# Patient Record
Sex: Female | Born: 1975 | Race: White | Hispanic: No
Health system: Southern US, Academic
[De-identification: ages and names within clinical notes are randomized; demographics above are authoritative.]

---
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
## 2022-01-15 ENCOUNTER — Other Ambulatory Visit (HOSPITAL_COMMUNITY): Payer: Self-pay | Admitting: Family Medicine

## 2022-01-15 DIAGNOSIS — Z1231 Encounter for screening mammogram for malignant neoplasm of breast: Secondary | ICD-10-CM

## 2022-01-30 ENCOUNTER — Ambulatory Visit (HOSPITAL_COMMUNITY): Payer: Self-pay

## 2022-02-02 ENCOUNTER — Ambulatory Visit (HOSPITAL_COMMUNITY): Payer: Self-pay

## 2022-03-06 ENCOUNTER — Ambulatory Visit (HOSPITAL_BASED_OUTPATIENT_CLINIC_OR_DEPARTMENT_OTHER): Payer: Self-pay

## 2022-03-06 ENCOUNTER — Other Ambulatory Visit: Payer: Self-pay

## 2022-11-23 ENCOUNTER — Other Ambulatory Visit (HOSPITAL_COMMUNITY): Payer: Self-pay | Admitting: Family Medicine

## 2022-11-23 DIAGNOSIS — Z1231 Encounter for screening mammogram for malignant neoplasm of breast: Secondary | ICD-10-CM

## 2022-11-30 ENCOUNTER — Other Ambulatory Visit: Payer: Self-pay

## 2022-11-30 ENCOUNTER — Encounter (HOSPITAL_COMMUNITY): Payer: Self-pay

## 2022-11-30 ENCOUNTER — Inpatient Hospital Stay
Admission: RE | Admit: 2022-11-30 | Discharge: 2022-11-30 | Disposition: A | Discharge status: Home / Self Care | Payer: 59 | Source: Ambulatory Visit | Attending: Family Medicine

## 2022-11-30 DIAGNOSIS — Z1231 Encounter for screening mammogram for malignant neoplasm of breast: Secondary | ICD-10-CM | POA: Insufficient documentation

## 2022-12-11 ENCOUNTER — Other Ambulatory Visit (HOSPITAL_COMMUNITY): Payer: Self-pay

## 2022-12-11 ENCOUNTER — Inpatient Hospital Stay
Admission: RE | Admit: 2022-12-11 | Discharge: 2022-12-11 | Disposition: A | Discharge status: Home / Self Care | Payer: 59 | Source: Ambulatory Visit

## 2022-12-11 ENCOUNTER — Inpatient Hospital Stay (HOSPITAL_BASED_OUTPATIENT_CLINIC_OR_DEPARTMENT_OTHER)
Admission: RE | Admit: 2022-12-11 | Discharge: 2022-12-11 | Disposition: A | Discharge status: Home / Self Care | Payer: 59 | Source: Ambulatory Visit

## 2022-12-11 ENCOUNTER — Other Ambulatory Visit: Payer: Self-pay

## 2022-12-11 DIAGNOSIS — R079 Chest pain, unspecified: Secondary | ICD-10-CM

## 2022-12-11 DIAGNOSIS — R9431 Abnormal electrocardiogram [ECG] [EKG]: Secondary | ICD-10-CM

## 2022-12-11 LAB — ECG 12 LEAD
Atrial Rate: 78 {beats}/min
Calculated P Axis: 28 degrees
Calculated R Axis: 25 degrees
Calculated T Axis: 35 degrees
PR Interval: 140 ms
QRS Duration: 70 ms
QT Interval: 370 ms
QTC Calculation: 421 ms
Ventricular rate: 78 {beats}/min

## 2023-01-30 IMAGING — MR MRI UPPER EXTREMITY W/O LT
5 of 8 series · 25 of 40 positions shown · IV contrast (gadolinium)
Comparison: None available.

﻿EXAM:  06161   MRI UPPER EXTREMITY W/O LT
INDICATION: Left elbow pain in the region of biceps tendon insertion.  No known history of trauma.  Prior forearm surgery, details of which are not available.
TECHNIQUE: Multiplanar, multisequential MRI of the left elbow was performed without gadolinium contrast.

[Series 7: PD · sagittal · left · 4.0mm · 0.33mm/px · 5 of 22 slices shown]
[im 1/22]
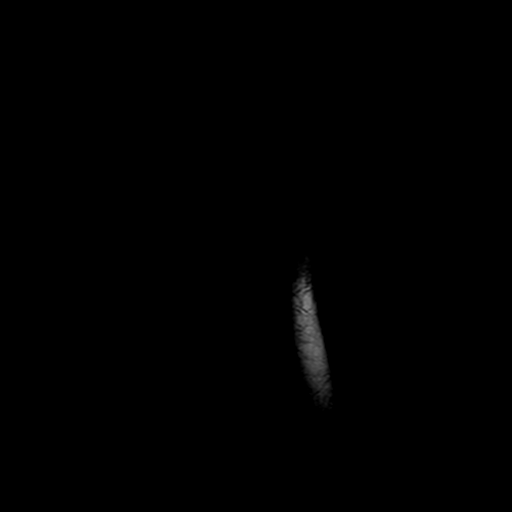
[im 5/22]
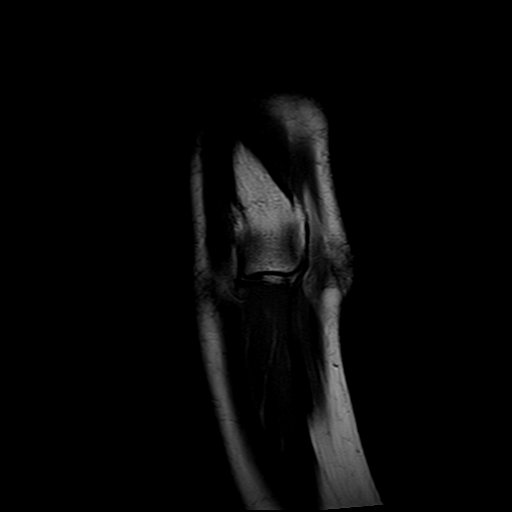
[im 9/22]
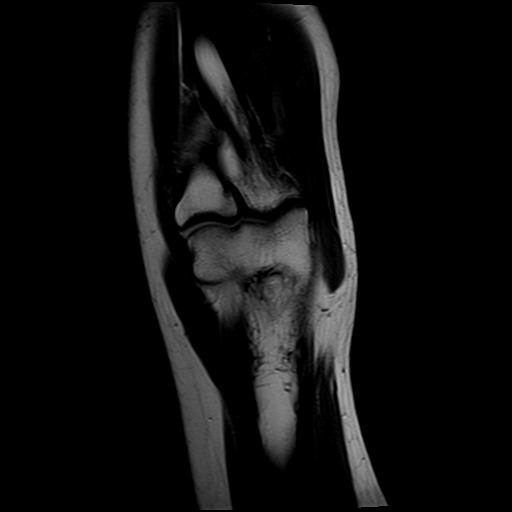
[im 13/22]
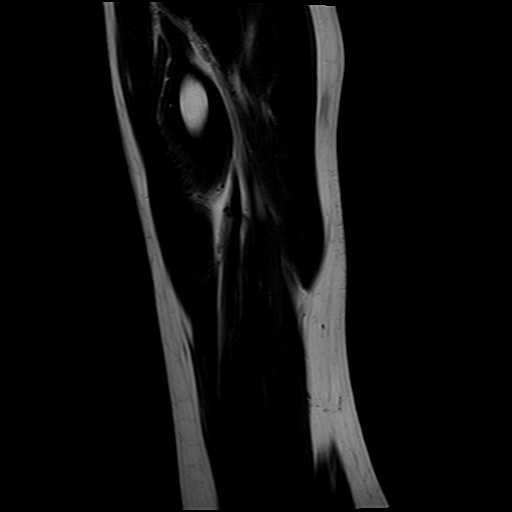
[im 17/22]
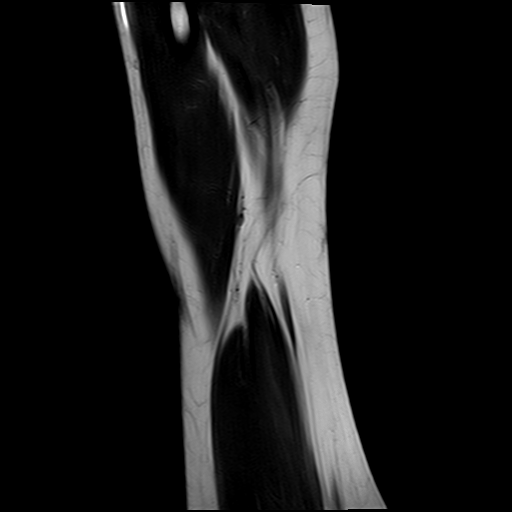

[Series 8: PD fat-sat · sagittal · left · 4.0mm · 0.44mm/px · 6 of 22 slices shown (1 of 4)]
[im 1/22]
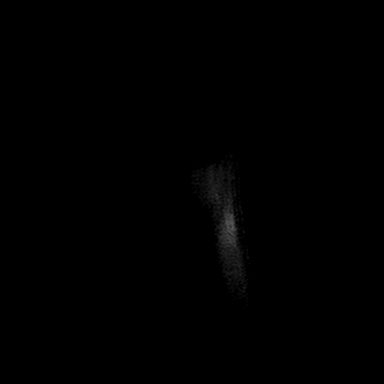
[im 5/22]
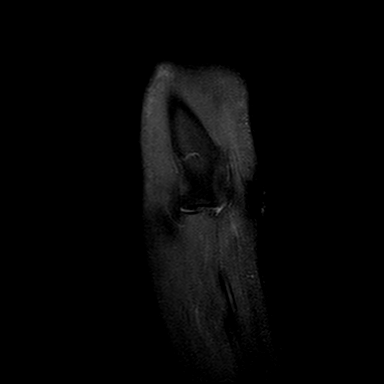
[im 9/22]
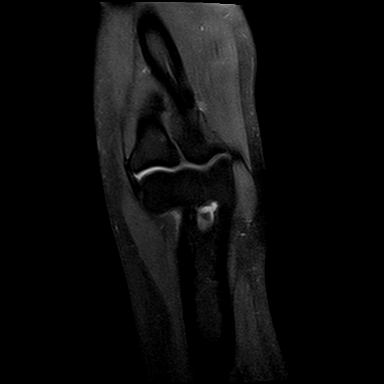
[im 13/22]
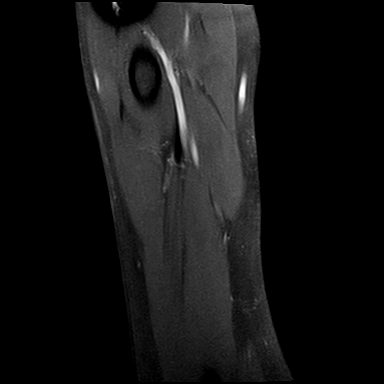
[im 17/22]
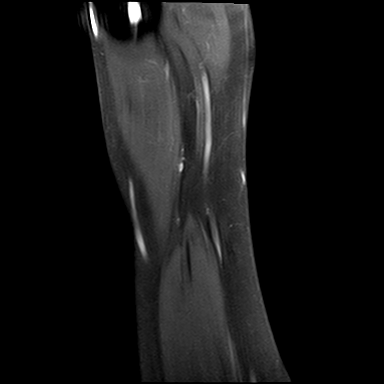
[im 22/22]
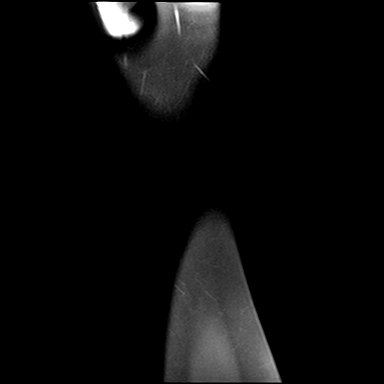

[Series 10: PD fat-sat · coronal · left · 3.5mm · 0.33mm/px · 4 of 20 slices shown (2 of 4)]
[im 1/20]
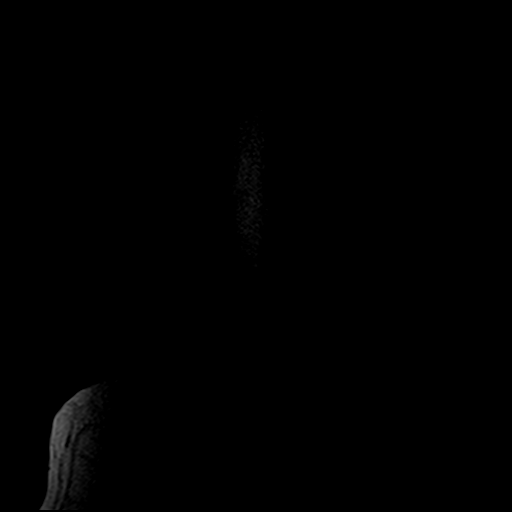
[im 7/20]
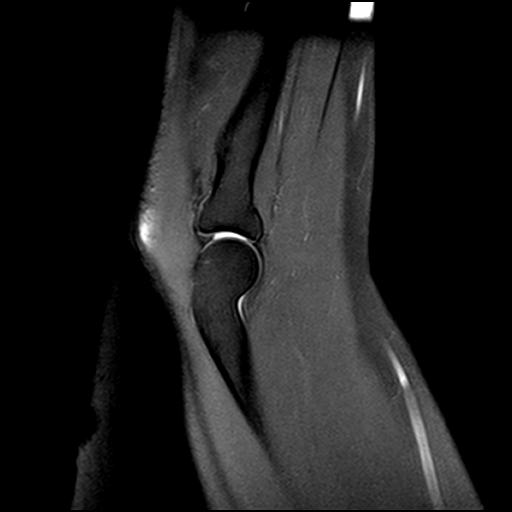
[im 13/20]
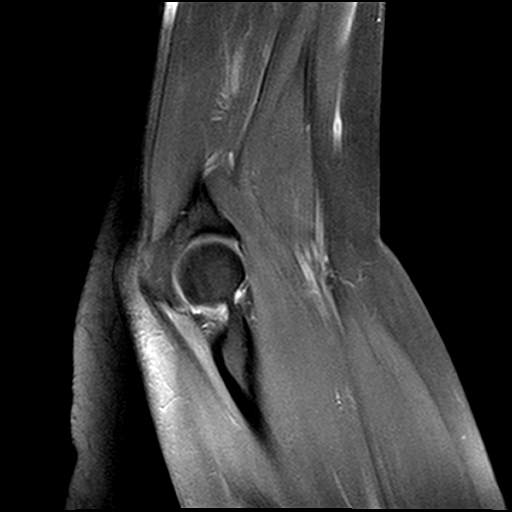
[im 20/20]
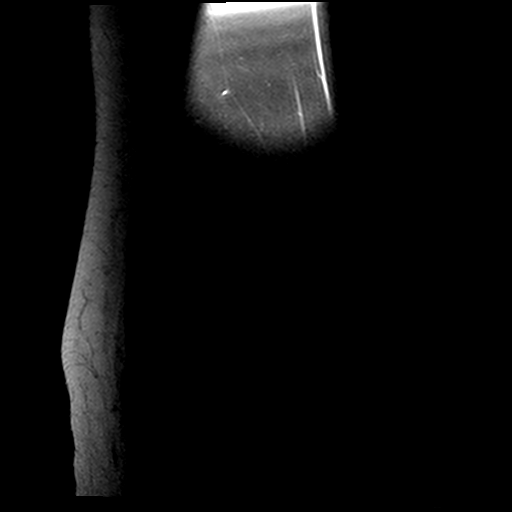

[Series 12: PD fat-sat · axial · left · 5.0mm · 0.36mm/px · z∈[-101,+28]mm · 5 of 25 slices shown (3 of 4)]
[im 1/25]
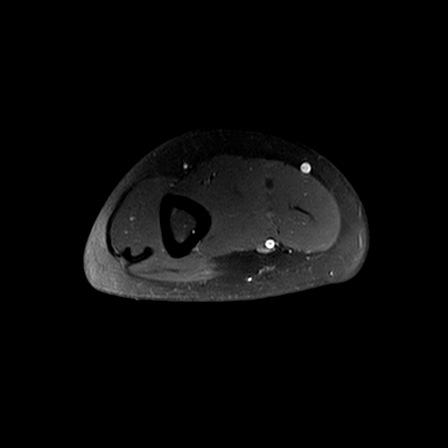
[im 7/25]
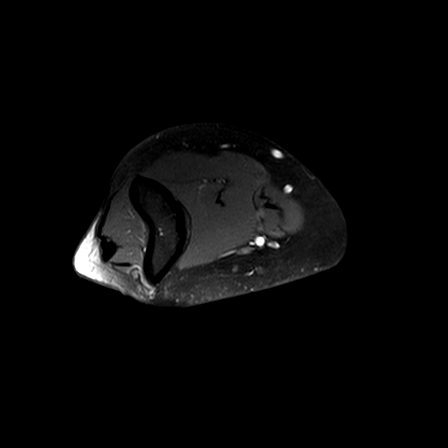
[im 13/25]
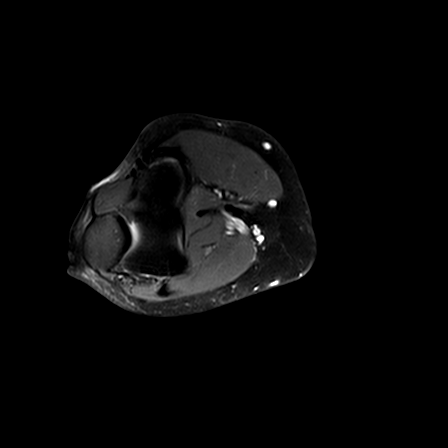
[im 19/25]
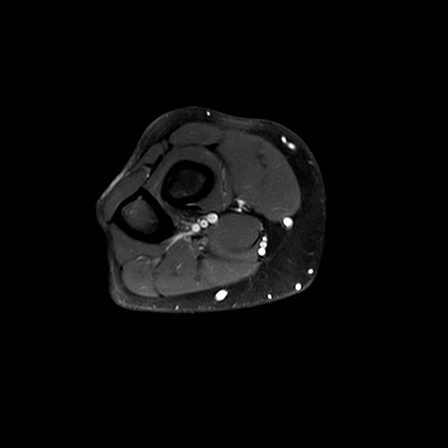
[im 25/25]
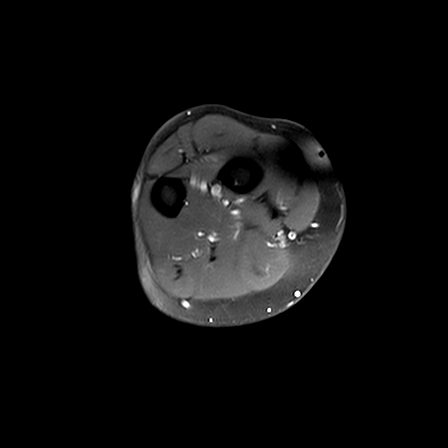

[Series 19: PD fat-sat · axial · left · 5.0mm · 0.36mm/px · z∈[-125,-16]mm · 5 of 24 slices shown (4 of 4)]
[im 1/24]
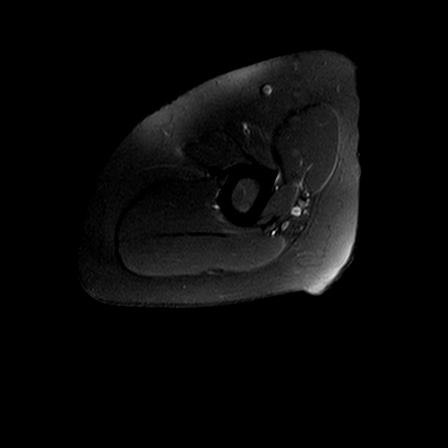
[im 6/24]
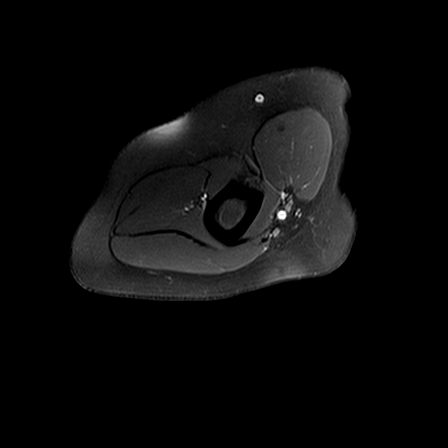
[im 12/24]
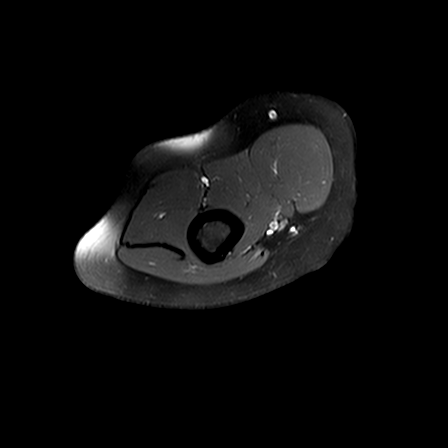
[im 18/24]
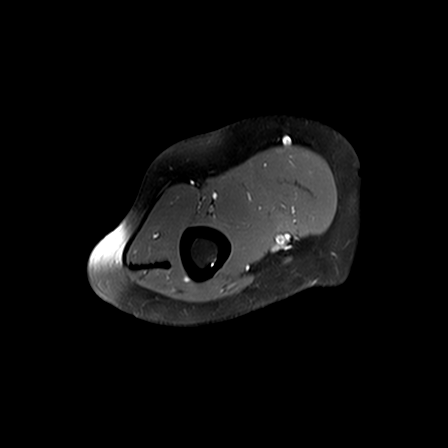
[im 24/24]
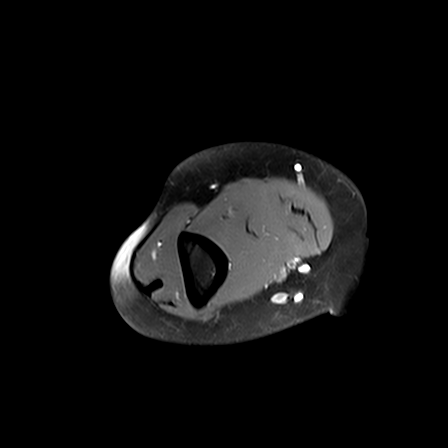

[25 of 40 positions shown; findings below may reference images not displayed]

FINDINGS: No acute bony lesions are seen at the left elbow.  The insertion of biceps tendon and brachialis tendon are normal at the elbow.

Triceps tendon insertion is normal.  No focal lesions of the articular surface of the elbow joint are noted. Radial and ulnar collateral ligaments are intact.  Vascular structures at the elbow are normal. Minimally prominent supratrochlear lymph nodes are noted in the distal upper arm.
IMPRESSION: 1. No acute bony lesions at the left elbow.

2. Distal insertion of biceps tendon and brachialis tendon are normal at the elbow.  No articular lesions of the elbow are seen.  Collateral ligaments are intact.

## 2023-04-04 ENCOUNTER — Other Ambulatory Visit: Payer: Self-pay

## 2023-04-12 ENCOUNTER — Encounter (HOSPITAL_COMMUNITY): Payer: Self-pay

## 2023-04-12 ENCOUNTER — Other Ambulatory Visit: Payer: Self-pay

## 2023-04-12 ENCOUNTER — Ambulatory Visit (HOSPITAL_COMMUNITY)
Admission: RE | Admit: 2023-04-12 | Discharge: 2023-04-12 | Disposition: A | Discharge status: Still In Hospital | Payer: 59 | Source: Ambulatory Visit

## 2023-04-12 ENCOUNTER — Other Ambulatory Visit (HOSPITAL_COMMUNITY): Payer: Self-pay

## 2023-04-12 DIAGNOSIS — M7522 Bicipital tendinitis, left shoulder: Secondary | ICD-10-CM

## 2023-04-12 NOTE — PT Evaluation (Signed)
[facility] Medicine [facility]  Outpatient Physical Therapy  [address]  [place], [address]  [phone]  (Fax) [phone]       Physical Therapy Upper Extremity Evaluation    Date: 04/12/2023  Patient's Name: Erica Solomon  Date of Birth: Nov 01, 1975  Physical Therapy Evaluation      Reason for Referral: L bicep tendinitis   PT diagnosis: L bicep tendinitis, L bicep strain, L shoulder impingement              SUBJECTIVE    Date of onset: 4 months ago     Mechanism of injury: gradual onset. Notes she work up one morning and reached for her creamer in the fridge and felt immediate stabbing pain.     Previous episodes/treatments: steroid injection 3 weeks ago with 2 days of relief     PLOF: no prior issues with L shoulder or UE     Medications for this problem:  none reported prescription or OTC    Diagnostic tests: x-ray revealing mild AC joint OA. Patient reports having MRI of L shoulder at [facility] with reports of no significant findings.     Patient goals: REDUCE PAIN and NORMALIZE FUNCTION    Occupation:  Clinical research associate     Next MD visit: 6 months, TBD    Pain location: L anterior shoulder and bicep soft tissue             Pain description: SHARP, DULL, ACHING, and STABBING    Pain frequency:  CONTINUOUS    Pain rating: Now 1/10   Best 1/10   Worst 10/10    Radiculopathy: denies     Pain increases with: ACTIVITY, LIFTING, and reaching behind back or head, sleeping on L side            decreases with : COLD and REST    Sensation: denies     Weakness: L UE     Sleep affected: denies     Headaches: denies     Dizziness: denies     Subjective Functional Reports:      Lifting: LIMITED        Patient-Specific Functional Score:    Problem Score   1. Washing hair/reaching behind back  5   2. Swimming  0   3. Shaving  7   TOTAL 4       OBJECTIVE    Shoulder AROM   right left   Flexion 180 97   Extension 80 50   Abduction 180 78   Adduction NT NT   ER C7 L ear   IR  Contralateral inferior border of scapula  L buttock        Elbow AROM    right left   Flexion [facility]   Extension [facility] WFL   Supination  WFL WFL   Pronation WFL WFL      ROM comment: LE elbow AROM WFL and non-painful       Strength (defined as __/5 based on 0/5 - 5/5 grading system)   right left   Shoulder flexion 5 4+   Shoulder abduction  5 4+   Shoulder IR 5 4   Shoulder ER 5 5   Elbow flexion  5 5   Elbow extension  5 5   Supination 5 5   Pronation 5 5   Wrist extension  5 5   Wrist flexion  5 5     Strength comment: only  slight pain with shoulder IR at neutral     Joint mobility: mild posterior L GH capsule tightness     Palpation: TTP along long head of bicep in bicipital groove and bicep muscle belly. No tenderness to palpation of pec minor, coracoid process, AC joint line, or posterior scapulohumeral soft tissue structures     Posture: rounded shoulders     Reflexes  Biceps 2   Triceps 2       Special tests:     YERGASON'S TEST: NEGATIVE LEFT    NEER'S SIGN:  unable to perform     KENNEDY HAWKINS TEST: POSITIVE LEFT    SPEEDS TEST: POSITIVE LEFT    LIFT OFF TEST:  unable to achieve position    ER lag sign: (-)    Empty/full can test: (-)    Cervical screening: clear       Treatment provided:REVIEW OF POC AND GOALS WITH PATIENT, ALL QUESTIONS ANSWERED, PATIENT EDUCATION, and THERAPEUTIC EXERCISE     Access Code: [email or web address]  URL: [email or web address]  Date: 04/12/2023  Prepared by: Unk Lightning    Exercises  - Seated Shoulder Scaption AAROM with Pulley at Side  - 2 x daily - 7 x weekly - 1 sets - 20-30 reps  - Seated Shoulder Flexion AAROM with Pulley Behind  - 2 x daily - 7 x weekly - 1 sets - 20-30 reps            ASSESSMENT    Impression: Patient is a 47 y/o female referred to OP PT services 2/2 L bicep tendinitis. Patient demonstrates inconclusive findings today for bicep tendinitis versus L shoulder impingement versus L shoulder adhesive capsulitis. Patient presents with painful ROM limitations in L  shoulder, but near full strength within achieved mobility with the exception of L shoulder IR weakness in neutral position. Pain with direct palpation to long head of biceps tendon in bicipital groove with tenderness of bicep muscle belly noted. Elbow ROM and strength WFL on L UE. Patient demonstrated good improvement with L shoulder ROM utilizing a PROM/AAROM approach with pulleys this date.  Patient verbalizes understanding and agreement with PT POC and HEP training. Patient educated for conservative treatment of L shoulder including rest and icing routine with use of NSAID's PRN for pain and inflammation management. Patient would benefit from skilled PT services at this time to address limitations noted during evaluation and improve patient's functional mobility and QOL.       Rehab potential: GOOD      Short term goals (3 weeks):  -Patient will demo independence with HEP to maximize functional outcomes.   -Patient will self report greater than or equal to 25% improvement with progress towards reaching functional goals and improving QOL.   -Patient will improve L shoulder AROM to 90% or greater of R shoulder AROM for improved functional mobility and activity participation.        Long term goals (6 weeks):  -Patient will improve gross L shoulder MMT to 5/5 to improve L UE strength for functional activity performance.   -Patient will improve total score on patient specific functional score to >/= 7 for improved patient tolerance during ADL, work, and recreational related activities.   -Patient will demonstrate improved muscular dysfunction in the L bicep to improve flexibility, strength, and reduce pain.   -Patient will report less than or equal to 4/10 max pain in the L shoulder during exacerbating activities for improved QOL and activity tolerance.  PLAN  Patient will attend 2 times per week x 6 weeks. Therapy may include, but is not limited to THERAPEUTIC EXERCISES, MYOFASCIAL/JOINT MOBILIZATION,  POSTURE/BODY MECHANICS, ERGONOMIC TRAINING, HOME INSTRUCTIONS, HEAT/COLD, ULTRASOUND, ELECTRICAL STIMULATION, and KINESIOTAPE    Plan for next visit: UBE, progress shoulder mobility, manual therapy/modalities PRN        Evaluation complexity:   Personal factors impacting POC: FREQUENT OR CHRONIC PAIN   Co-morbidities impacting POC:  none  Complexity of physical exam: INCLUDING MUSCULOSKELETAL SYSTEM (POSTURE, ROM, STRENGTH, HEIGHT/WEIGHT) and REFERRAL IS FOR A CHRONIC PROBLEM   Clinical Presentation: STABLE   Evaluation Complexity: LOW-HISTORY 0, EXAMINATION 1-2, STABLE PRESENTATION        Total Session Time 40, Timed code minutes 10, and Untimed code minutes 30        Intervention minutes: EVALUATION 30 minutes and THERAPEUTIC EXERCISE 10 minutes    Unk Lightning, PT  04/12/2023, 10:56    Start of Service: _________          Certification:    From:______  Through:_________    I certify the need for these services furnished under this plan of treatment and while under my care.    Referring Provider Signature: _______________     Date : _____________________    Printed Name of Referring Provider: __________________________________________

## 2023-04-17 ENCOUNTER — Other Ambulatory Visit: Payer: Self-pay

## 2023-04-17 ENCOUNTER — Ambulatory Visit (HOSPITAL_COMMUNITY)
Admission: RE | Admit: 2023-04-17 | Discharge: 2023-04-17 | Disposition: A | Discharge status: Still In Hospital | Payer: 59 | Source: Ambulatory Visit

## 2023-04-17 NOTE — PT Treatment (Signed)
[facility] Medicine [facility]  Outpatient Physical Therapy  [address]  [place], [address]  [phone]  (Fax) [phone]    Physical Therapy Treatment Note    Date: 04/17/2023  Patient's Name: Erica Solomon  Date of Birth: [birth year]  Physical Therapy Visit            Visit #/POC:2 of 20 (til 6/30)  Authorization:20 (starts over 7/1)  POC Signed?: yes  POC Ends: 8/2  Next Progress Note Due: 7/21      Evaluating Physical Therapist: Unk Lightning, DPT  PT diagnosis/Reason for Referral: L bicep tendinitis   Next Scheduled Physician Appointment: TBA  Allergies/Contraindications: NKA          Subjective: Patient states her shoulder is really hurting today, and was unable to perform her pulleys this morning. Patient states most of her pain is along biceps tendon starting at the groove, down to mid muscle belly.    Objective: Trial of inhibitory taping to L biceps along with 1 I strip across anterior GH, across [facility] joint down to L scapula to improve humeral head positioning. PROM with gentle MFR and joint mobs. Ended with supine pectoralis stretch with towel roll placed along thoracic spine.    Measured ROM: Not assessed today, 04/17/23    EXERCISE/ACTIVITY NAME REPETITIONS RESISTANCE COMPLETED THIS DOS   PROM/JT mobs    manual y   STM to Biceps    manual y   Supine pectoralis stretch with towel roll along t-spine   2 min  y                                             Assessment: Good response with session, noting improvement in pain. Discussed with patient about being consistent with CP and NSAIDs to help reduce inflammation.    Short term goals (3 weeks):  -Patient will demo independence with HEP to maximize functional outcomes.   -Patient will self report greater than or equal to 25% improvement with progress towards reaching functional goals and improving QOL.   -Patient will improve L shoulder AROM to 90% or greater of R shoulder AROM for improved functional mobility and activity  participation.           Long term goals (6 weeks):  -Patient will improve gross L shoulder MMT to 5/5 to improve L UE strength for functional activity performance.   -Patient will improve total score on patient specific functional score to >/= 7 for improved patient tolerance during ADL, work, and recreational related activities.   -Patient will demonstrate improved muscular dysfunction in the L bicep to improve flexibility, strength, and reduce pain.   -Patient will report less than or equal to 4/10 max pain in the L shoulder during exacerbating activities for improved QOL and activity tolerance.        Plan: Assess tol of taping. Cont with ROM and scapular strengthening.    Total Session Time 40, Timed code minutes 35, and Untimed code minutes 5  JOINT MOBILIZATION/MFR 35 minutes      Laurence Aly, PTA  04/17/2023, 13:59

## 2023-04-19 ENCOUNTER — Ambulatory Visit
Admission: RE | Admit: 2023-04-19 | Discharge: 2023-04-19 | Disposition: A | Discharge status: Still In Hospital | Payer: 59 | Source: Ambulatory Visit

## 2023-04-19 ENCOUNTER — Other Ambulatory Visit: Payer: Self-pay

## 2023-04-19 NOTE — PT Treatment (Signed)
[facility] Medicine [facility]  Outpatient Physical Therapy  [address]  [place], [address]  [phone]  (Fax) [phone]    Physical Therapy Treatment Note    Date: 04/19/2023  Patient's Name: Erica Solomon  Date of Birth: 1976/10/16  Physical Therapy Visit            Visit #/POC:3 of 20 (til 6/30)  Authorization:20 (starts over 7/1)  POC Signed?: yes  POC Ends: 8/2  Next Progress Note Due: 7/21        Evaluating Physical Therapist: Unk Lightning, DPT  PT diagnosis/Reason for Referral: L bicep tendinitis   Next Scheduled Physician Appointment: TBA  Allergies/Contraindications: NKA              Subjective:Patient continues to report of increased shoulder pain. Could not see much improvement with taping. Is willing to try other techniques but asked to hold today d/t having to attend a shower for her son tomorrow.     Objective: PROM and STM to L shoulder followed by wand ER and flexion in supine position. Trial of CP and IFC x 12 min for pain control.     Measured ROM: Not assessed today, 04/19/23     EXERCISE/ACTIVITY NAME REPETITIONS RESISTANCE COMPLETED THIS DOS   PROM/JT mobs      manual y   STM to Biceps      manual y   Supine pectoralis stretch with towel roll along t-spine    2 min   N     Wand flexion  Wand ER 10  10   Y  Y                                                               Assessment: Good response to IFC and CP. Patient has restriction with ER and Abd accompanied by pain.     Short term goals (3 weeks):  -Patient will demo independence with HEP to maximize functional outcomes.   -Patient will self report greater than or equal to 25% improvement with progress towards reaching functional goals and improving QOL.   -Patient will improve L shoulder AROM to 90% or greater of R shoulder AROM for improved functional mobility and activity participation.           Long term goals (6 weeks):  -Patient will improve gross L shoulder MMT to 5/5 to improve L UE strength for  functional activity performance.   -Patient will improve total score on patient specific functional score to >/= 7 for improved patient tolerance during ADL, work, and recreational related activities.   -Patient will demonstrate improved muscular dysfunction in the L bicep to improve flexibility, strength, and reduce pain.   -Patient will report less than or equal to 4/10 max pain in the L shoulder during exacerbating activities for improved QOL and activity tolerance.         Plan:  Cont per PT POC.    Total Session Time 45, Timed code minutes 30, and Untimed code minutes 15  JOINT MOBILIZATION/MFR 30 minutes and ELECTRICAL STIMULATION 12 minutes      Laurence Aly, PTA  04/19/2023, 12:31

## 2023-04-24 ENCOUNTER — Other Ambulatory Visit: Payer: Self-pay

## 2023-04-24 ENCOUNTER — Ambulatory Visit (HOSPITAL_COMMUNITY)
Admission: RE | Admit: 2023-04-24 | Discharge: 2023-04-24 | Disposition: A | Discharge status: Still In Hospital | Payer: 59 | Source: Ambulatory Visit

## 2023-04-24 DIAGNOSIS — M7522 Bicipital tendinitis, left shoulder: Secondary | ICD-10-CM | POA: Insufficient documentation

## 2023-04-24 NOTE — PT Treatment (Signed)
[facility] Medicine [facility]  Outpatient Physical Therapy  [address]  [place], [address]  [phone]  (Fax) [phone]    Physical Therapy Treatment Note    Date: 04/24/2023  Patient's Name: Erica Solomon  Date of Birth: 08/25/1976  Physical Therapy Visit            Visit #/POC:4 of 20 (til 6/30)  Authorization:20 (starts over 7/1)  POC Signed?: yes  POC Ends: 8/2  Next Progress Note Due: 7/21        Evaluating Physical Therapist: Unk Lightning, DPT  PT diagnosis/Reason for Referral: L bicep tendinitis   Next Scheduled Physician Appointment: TBA  Allergies/Contraindications: NKA              Subjective: Patient reported improvement with IFC after last session that last for about 1-2 days. Patient states she moved her arm quick when she was in her car on Monday and was in an extreme amount of pain.     Objective: demonstrates pain with eccentric lowering. Continued with IFC x 12 min for pain control followed by AAROM with flexion and abduction, sidelying ER, and supine scapular retraction. Patient did note that her shoulder did not feel as tight after session. Trial of inhibitory deltoid taping with decompression I strip at [facility] joint. Issued additional HEP.     Measured ROM: Standing flexion 104, abd 110, IR to L glute, ER unable to reach L ear.     EXERCISE/ACTIVITY NAME REPETITIONS RESISTANCE COMPLETED THIS DOS   PROM/JT mobs      manual n   STM to Biceps      manual n   Supine pectoralis stretch with towel roll along t-spine    2 min   N     Wand flexion  Wand ER 10  10   Y  Y     supine PB abduction 10   y   Sidelying ER 10   y   Supine scapular retraction 10 x 5 sec   y   LTR with arms at 17* 10   y            Access Code: [email or web address]  URL: [email or web address]  Date: 04/24/2023  Prepared by: Laurence Aly    Exercises  - Supine Lower Trunk Rotation  - 1-2 x daily - 7 x weekly - 1-2 sets - 10 reps  - Supine Scapular Retraction  - 1-2 x daily - 7 x weekly - 1-2 sets - 10 reps -  5 hold  - Sidelying Shoulder External Rotation AROM  - 1-2 x daily - 7 x weekly - 1-2 sets - 10 reps    Assessment: Patient has improved with flexion and abduction. Remains limited with functional rotation. Does seem to be responding well with AAROM and scapular engagement exercises.     Short term goals (3 weeks):  -Patient will demo independence with HEP to maximize functional outcomes.   -Patient will self report greater than or equal to 25% improvement with progress towards reaching functional goals and improving QOL.   -Patient will improve L shoulder AROM to 90% or greater of R shoulder AROM for improved functional mobility and activity participation.           Long term goals (6 weeks):  -Patient will improve gross L shoulder MMT to 5/5 to improve L UE strength for functional activity performance.   -Patient will improve total score on patient specific functional score to >/= 7 for improved  patient tolerance during ADL, work, and recreational related activities.   -Patient will demonstrate improved muscular dysfunction in the L bicep to improve flexibility, strength, and reduce pain.   -Patient will report less than or equal to 4/10 max pain in the L shoulder during exacerbating activities for improved QOL and activity tolerance.         Plan:  Cont with mobility and strengthening per patient tol.    Total Session Time 45, Timed code minutes 30, and Untimed code minutes 15  THERAPEUTIC EXERCISE 30 minutes and ELECTRICAL STIMULATION 12 minutes      Laurence Aly, PTA  04/24/2023, 13:57

## 2023-04-26 ENCOUNTER — Ambulatory Visit (HOSPITAL_COMMUNITY): Payer: Self-pay

## 2023-05-01 ENCOUNTER — Ambulatory Visit (HOSPITAL_COMMUNITY)
Admission: RE | Admit: 2023-05-01 | Discharge: 2023-05-01 | Disposition: A | Discharge status: Still In Hospital | Payer: 59 | Source: Ambulatory Visit

## 2023-05-01 ENCOUNTER — Other Ambulatory Visit: Payer: Self-pay

## 2023-05-01 NOTE — PT Treatment (Addendum)
[facility] Medicine [facility]  Outpatient Physical Therapy  [address]  [place], [address]  [phone]  (Fax) [phone]    Physical Therapy Treatment Note    Date: 05/01/2023  Patient's Name: Erica Solomon  Date of Birth: 11-18-1975  Physical Therapy Visit            Visit #/POC:5 of 20 (til 6/30)  Authorization:20 (starts over 7/1)  POC Signed?: yes  POC Ends: 8/2  Next Progress Note Due: 7/21        Evaluating Physical Therapist: Unk Lightning, DPT  PT diagnosis/Reason for Referral: L bicep tendinitis   Next Scheduled Physician Appointment: TBA  Allergies/Contraindications: NKA              Subjective: Reports that she really felt like the tape did help, just took it off. Can't sleep on that side. Having the most pain in the front of the shoulder today.     Objective: Began treatment with CP and IFC to L shoulder followed by kinesiotaping to ant/middle/post deltoid and an "I" strip for mechanical correction of forward humeral head.     Measured ROM: Standing flexion 104, abd 110, IR to L glute, ER unable to reach L ear.     EXERCISE/ACTIVITY NAME REPETITIONS RESISTANCE COMPLETED THIS DOS   PROM/JT mobs      manual Y   STM to Biceps      manual n   Supine pectoralis stretch with towel roll along t-spine    2 min   N     Wand flexion  Wand ER 10  10   Y  Y     supine PB abduction 10   y   Sidelying ER 10   y   Supine scapular retraction 10 x 5 sec   y   LTR with arms at 90* 10   N    scapular PNFs (passive)   y     Access Code: [email or web address]  URL: [email or web address]  Date: 04/24/2023  Prepared by: Laurence Aly    Exercises  - Supine Lower Trunk Rotation  - 1-2 x daily - 7 x weekly - 1-2 sets - 10 reps  - Supine Scapular Retraction  - 1-2 x daily - 7 x weekly - 1-2 sets - 10 reps - 5 hold  - Sidelying Shoulder External Rotation AROM  - 1-2 x daily - 7 x weekly - 1-2 sets - 10 reps    Assessment: Pt has most limitations with posterior and inferior capsular mobility.  She responded  favorably to kinesiotaping. Tolerated today's session well.   Short term goals (3 weeks):   -Patient will demo independence with HEP to maximize functional outcomes.   -Patient will self report greater than or equal to 25% improvement with progress towards reaching functional goals and improving QOL.   -Patient will improve L shoulder AROM to 90% or greater of R shoulder AROM for improved functional mobility and activity participation.           Long term goals (6 weeks):  -Patient will improve gross L shoulder MMT to 5/5 to improve L UE strength for functional activity performance.   -Patient will improve total score on patient specific functional score to >/= 7 for improved patient tolerance during ADL, work, and recreational related activities.   -Patient will demonstrate improved muscular dysfunction in the L bicep to improve flexibility, strength, and reduce pain.   -Patient will report less than or equal to 4/10  max pain in the L shoulder during exacerbating activities for improved QOL and activity tolerance.       Plan:  Cont with joint mobility and strengthening per patient tol.    Total Session Time 42, Timed code minutes 30, and Untimed code minutes 12  THERAPEUTIC EXERCISE 30 minutes and ELECTRICAL STIMULATION 12 minutes    Vicci Reder, PTA 05/01/2023 15:16

## 2023-05-03 ENCOUNTER — Other Ambulatory Visit: Payer: Self-pay

## 2023-05-03 ENCOUNTER — Ambulatory Visit (HOSPITAL_COMMUNITY)
Admission: RE | Admit: 2023-05-03 | Discharge: 2023-05-03 | Disposition: A | Discharge status: Still In Hospital | Payer: 59 | Source: Ambulatory Visit

## 2023-05-03 NOTE — PT Treatment (Signed)
[facility]  Outpatient Physical Therapy  [address]  [place], [address]  [phone]  (Fax) [phone]    Physical Therapy Treatment Note    Date: 05/03/2023  Patient's Name: Erica Solomon  Date of Birth: 1976-02-22  Physical Therapy Visit          Visit #/POC: 6 of 12  Authorization: 3 of 20 (until 04/10/24)  POC Signed?: yes  POC Ends: 8/2  Next Progress Note Due: 7/21        Evaluating Physical Therapist: Unk Lightning, DPT  PT diagnosis/Reason for Referral: L bicep tendinitis   Next Scheduled Physician Appointment: TBA  Allergies/Contraindications: NKA        Subjective: Patient reports she had a deep tissue massage yesterday with focus on anterior GH joint line and pec musculature. She reports improvement today. 2/10 pain upon arrival and was 4-5/10 before her massage yesterday.      Objective: see below for treatment details     EXERCISE/ACTIVITY NAME REPETITIONS RESISTANCE COMPLETED THIS DOS   PROM/JT mobs    L GH AP and inferior mobs to reduce posterior capsule tightness      X10' Manual    manual N    Y   STM to Biceps      manual N   Moist heat L anterior chest wall  X5'   Y   Supine pectoralis stretch with pool noodle along t-spine    2 min  on pool noodle Y   Wand flexion  Wand ER 2x15  2x15   Y  Y   supine PB abduction 10   N   Sidelying ER 10   N   Supine scapular retraction 10 x 5 sec    N   LTR with arms at 90* 10   N    scapular PNFs (passive)     N         Assessment: Patient demo's improved L shoulder AROM following treatment today focused on posterior capsule tightness and anterior chest wall musculoskeletal tension. 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 Treatment kept more conservative today 2/2 deep tissue massage yesterday. Patient will benefit from continued skilled PT services at this time to address remaining deficits and improved functional mobility/QOL.       Short term goals (3 weeks):    -Patient will demo independence with HEP to maximize functional outcomes.   -Patient will self report greater than or equal to 25% improvement with progress towards reaching functional goals and improving QOL.   -Patient will improve L shoulder AROM to 90% or greater of R shoulder AROM for improved functional mobility and activity participation.        Long term goals (6 weeks):  -Patient will improve gross L shoulder MMT to 5/5 to improve L UE strength for functional activity performance.   -Patient will improve total score on patient specific functional score to >/= 7 for improved patient tolerance during ADL, work, and recreational related activities.   -Patient will demonstrate improved muscular dysfunction in the L bicep to improve flexibility, strength, and reduce pain.   -Patient will report less than or equal to 4/10 max pain in the L shoulder during exacerbating activities for improved QOL and activity tolerance.       Plan:  Cont with joint mobility and strengthening per patient tol.      Total Session Time 30 and Timed code minutes 30  THERAPEUTIC  EXERCISE 20 minutes and JOINT MOBILIZATION/MFR 10 minutes      Unk Lightning, PT  05/03/2023, 11:45

## 2023-05-08 ENCOUNTER — Other Ambulatory Visit: Payer: Self-pay

## 2023-05-08 ENCOUNTER — Ambulatory Visit (HOSPITAL_COMMUNITY)
Admission: RE | Admit: 2023-05-08 | Discharge: 2023-05-08 | Disposition: A | Discharge status: Still In Hospital | Payer: 59 | Source: Ambulatory Visit

## 2023-05-08 DIAGNOSIS — M7522 Bicipital tendinitis, left shoulder: Secondary | ICD-10-CM

## 2023-05-08 NOTE — PT Treatment (Signed)
[facility]  Outpatient Physical Therapy  [address]  [place], [address]  [phone]  (Fax) [phone]    Physical Therapy Treatment Note    Date: 05/08/2023  Patient's Name: Erica Solomon  Date of Birth: [birth year]  Physical Therapy Visit          Visit #/POC: 7 of 12  Authorization: 3 of 20 (until 04/10/24)  POC Signed?: yes  POC Ends: 8/2  Next Progress Note Due: 7/21        Evaluating Physical Therapist: Unk Lightning, DPT  PT diagnosis/Reason for Referral: L bicep tendinitis   Next Scheduled Physician Appointment: TBA  Allergies/Contraindications: NKA        Subjective: Woke up Tuesday morning with no pain in her shoulder. Is able to get her hand on her hip today.     Objective: see below for treatment details     EXERCISE/ACTIVITY NAME REPETITIONS RESISTANCE COMPLETED THIS DOS   PROM/JT mobs    L GH AP and inferior mobs to reduce posterior capsule tightness      X10' Manual    manual N    Y   STM to Biceps      manual N   Moist heat L anterior chest wall  X5'   Y   Supine pectoralis stretch with pool noodle along t-spine    2 min  on pool noodle Y   Wand flexion  Wand ER 2x15  2x15   Y  Y   supine PB abduction 10   N   Sidelying ER 10   N   Supine scapular retraction 10 x 5 sec    N   LTR with arms at 90* 10   N    scapular PNFs (passive)     N         Assessment: Pt has shown progress over the past couple visits with improved ROM though still painful. She is able to get her L hand onto her hip, also reports no pain upon waking up yesterday morning.    Short term goals (3 weeks):   -Patient will demo independence with HEP to maximize functional outcomes.   -Patient will self report greater than or equal to 25% improvement with progress towards reaching functional goals and improving QOL.   -Patient will improve L shoulder AROM to 90% or greater of R shoulder AROM for improved functional mobility and activity participation.     Long term goals (6  weeks):  -Patient will improve gross L shoulder MMT to 5/5 to improve L UE strength for functional activity performance.   -Patient will improve total score on patient specific functional score to >/= 7 for improved patient tolerance during ADL, work, and recreational related activities.   -Patient will demonstrate improved muscular dysfunction in the L bicep to improve flexibility, strength, and reduce pain.   -Patient will report less than or equal to 4/10 max pain in the L shoulder during exacerbating activities for improved QOL and activity tolerance.      Plan:  Cont with joint mobility and strengthening per patient tol.      Total Session Time 40 and Timed code minutes 40  THERAPEUTIC EXERCISE 20 minutes and JOINT MOBILIZATION/MFR 20 minutes      Jullia Mulligan, PTA 05/08/2023 14:54

## 2023-05-10 ENCOUNTER — Other Ambulatory Visit: Payer: Self-pay

## 2023-05-10 ENCOUNTER — Ambulatory Visit
Admission: RE | Admit: 2023-05-10 | Discharge: 2023-05-10 | Disposition: A | Discharge status: Still In Hospital | Payer: 59 | Source: Ambulatory Visit

## 2023-05-10 NOTE — PT Treatment (Addendum)
[facility]  Outpatient Physical Therapy  [address]  [place], [address]  [phone]  (Fax) [phone]    Physical Therapy Treatment Note    Date: 05/10/23  Patient's Name: Erica Solomon  Date of Birth: 10-25-1975  Physical Therapy Visit            Visit #/POC: 8 of 12  Authorization: 4 of 20 (until 04/10/24)  POC Signed?: yes  POC Ends: 8/2  Next Progress Note Due: 7/21        Evaluating Physical Therapist: Unk Lightning, DPT  PT diagnosis/Reason for Referral: L bicep tendinitis   Next Scheduled Physician Appointment: TBA  Allergies/Contraindications: NKA        Subjective: Patient has RTD appointment on Monday. Patient feels as though she is 60% improved. Mobility and pain has improved, but still does not have full ROM, scapular compensation noted with active ROM. Pain no longer awakens her at night, no radicular symptoms noted. Still has difficulty don/doff shirt, but is improving. Most pain with functional rotation. Is able to place hand on hip now. Pain does still rate up 7/10.     Objective: see below for treatment details. During exercises patient rolled over onto L shoulder and had very significant pain. Patient mentions that this pain happens every time she rolls over on that side.      Patient-Specific Functional Score:     Problem Score 7/19   1. Washing hair/reaching behind back  5 8   2. Swimming  0 0   3. Shaving  7 7   TOTAL 4 5              Shoulder AROM    right Left / 05/10/23   Flexion 180 97/  132   Extension 80 50/ 50   Abduction 180 78/  124   Adduction NT NT   ER C7 L ear/  lateral C7   IR Contralateral inferior border of scapula  L buttock / near PSIS       EXERCISE/ACTIVITY NAME REPETITIONS RESISTANCE COMPLETED THIS DOS   PROM/JT mobs     L GH AP and inferior mobs to reduce posterior capsule tightness       X10' Manual     manual N     Y   STM to Biceps      manual N   Moist heat L anterior chest wall  X5'    Y   Supine pectoralis stretch with  pool noodle along t-spine    2 min  on pool noodle Y   Wand flexion  Wand ER  Standing wand extension 2x15  2x15  2 x 10   Y  Y  Y    supine PB abduction 10   N   Sidelying ER 10   N   Supine scapular retraction 10 x 5 sec    N   LTR with arms at 90* 10   N    scapular PNFs (passive)     N         Assessment: Pt has shown progress over the past couple visits with improved ROM though still painful.      Short term goals (3 weeks):   -Patient will demo independence with HEP to maximize functional outcomes. MET 7/19  -Patient will self report greater than or equal to 25% improvement with progress towards reaching functional goals and improving QOL. MET 7/19  -Patient will improve L shoulder  AROM to 90% or greater of R shoulder AROM for improved functional mobility and activity participation. Progressing     Long term goals (6 weeks):  -Patient will improve gross L shoulder MMT to 5/5 to improve L UE strength for functional activity performance.   -Patient will improve total score on patient specific functional score to >/= 7 for improved patient tolerance during ADL, work, and recreational related activities. Progressing  -Patient will demonstrate improved muscular dysfunction in the L bicep to improve flexibility, strength, and reduce pain. Progressing  -Patient will report less than or equal to 4/10 max pain in the L shoulder during exacerbating activities for improved QOL and activity tolerance. Not Met     Plan: continue to progress as tolerated     Total Session Time 37, Timed code minutes 27, and Untimed code minutes 10  THERAPEUTIC EXERCISE 27 minutes      Laurence Aly, PTA  05/10/2023, 14:01

## 2023-05-16 ENCOUNTER — Ambulatory Visit (HOSPITAL_COMMUNITY): Payer: Self-pay

## 2023-05-17 ENCOUNTER — Ambulatory Visit (HOSPITAL_COMMUNITY): Payer: Self-pay

## 2023-05-27 ENCOUNTER — Ambulatory Visit (HOSPITAL_COMMUNITY): Payer: Self-pay

## 2023-05-28 IMAGING — MR MRI SHOULDER LT W/O CONTRAST
6 series · 39 of 40 positions shown · IV contrast (gadolinium)
Comparison: None available.

﻿EXAM:  61991   MRI SHOULDER LT W/O CONTRAST
INDICATION: 47-year-old with left shoulder pain for few months. Limited range of motion.  No history of trauma or prior surgery.
TECHNIQUE: Multiplanar multisequential MRI of the left shoulder was performed without gadolinium contrast.

[Series 6: T1 · oblique · left · 3.5mm · 0.33mm/px · 7 of 18 slices shown]
[im 1/18]
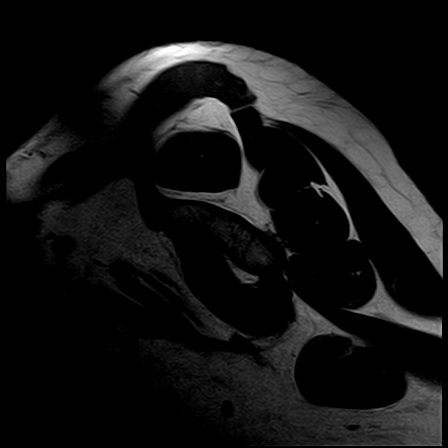
[im 3/18]
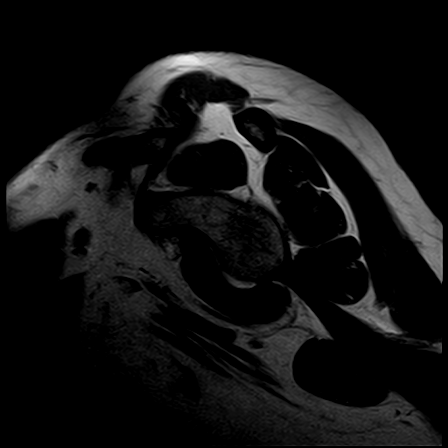
[im 6/18]
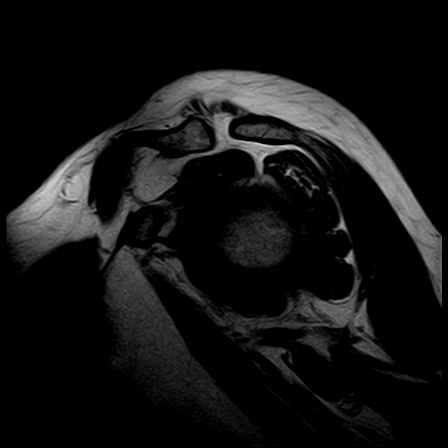
[im 9/18]
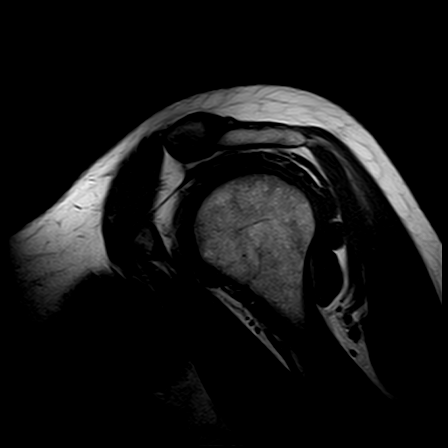
[im 12/18]
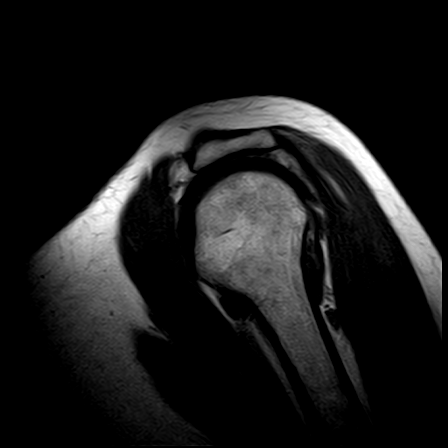
[im 15/18]
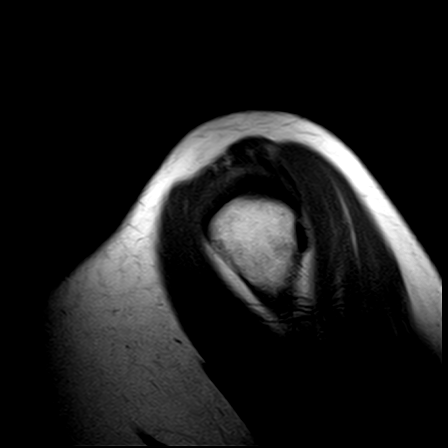
[im 18/18]
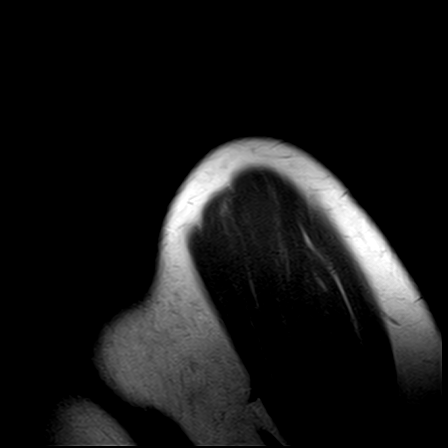

[Series 7: PD fat-sat · axial · left · 4.0mm · 0.50mm/px · z∈[-45,+31]mm · 7 of 18 slices shown (1 of 2)]
[im 1/18]
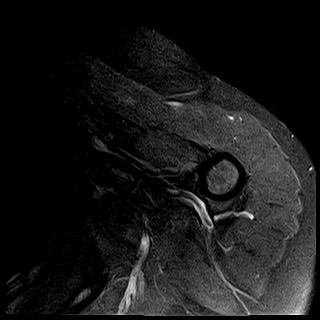
[im 3/18]
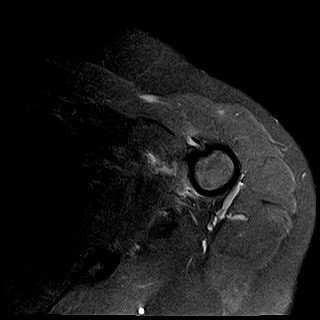
[im 6/18]
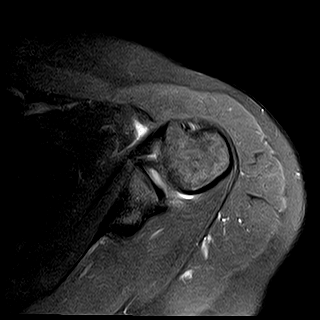
[im 9/18]
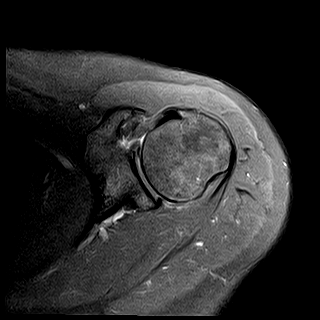
[im 12/18]
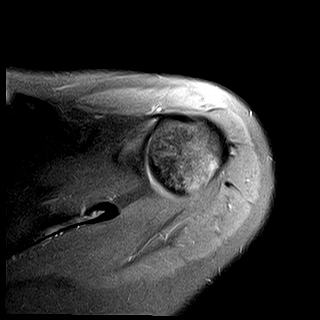
[im 15/18]
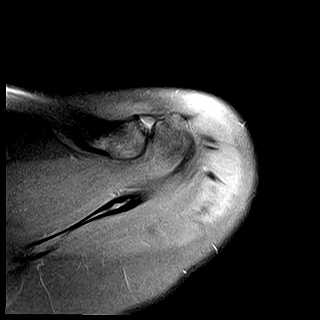
[im 18/18]
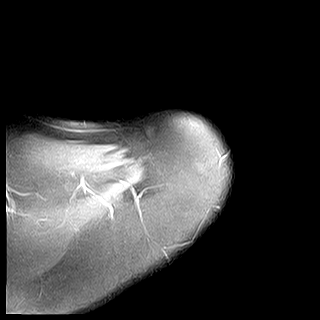

[Series 8: STIR · oblique · left · 3.5mm · 0.47mm/px · 6 of 18 slices shown (1 of 2)]
[im 1/18]
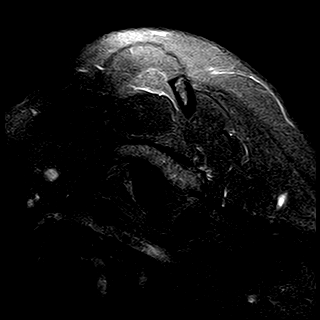
[im 4/18]
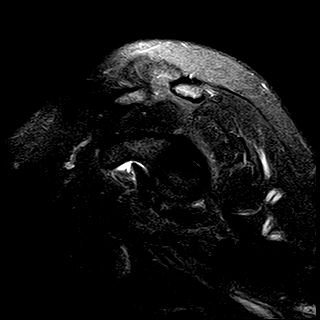
[im 7/18]
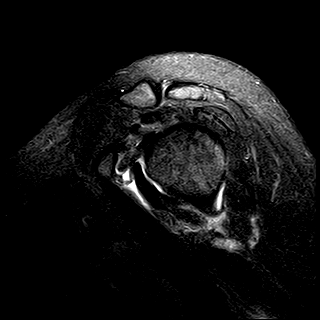
[im 11/18]
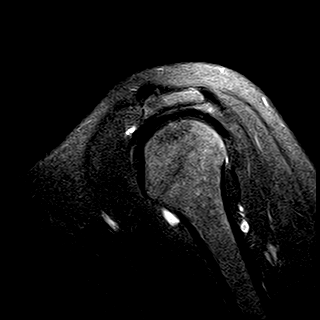
[im 14/18]
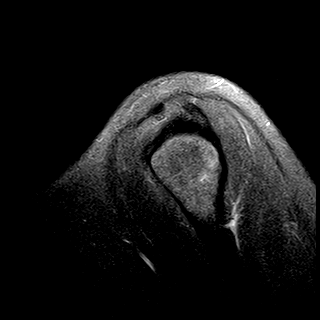
[im 18/18]
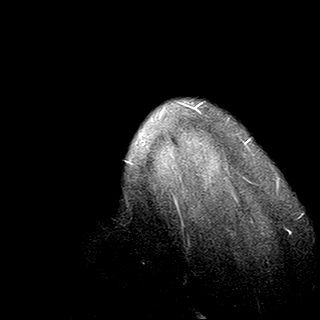

[Series 9: T2 fat-sat · axial · left · 4.0mm · 0.42mm/px · z∈[-58,+45]mm · 8 of 24 slices shown]
[im 1/24]
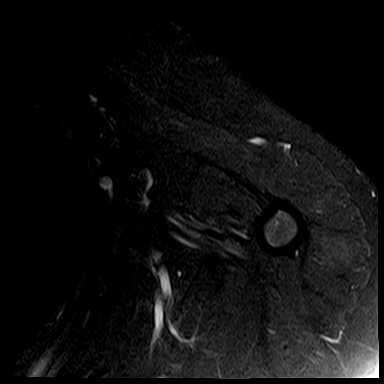
[im 4/24]
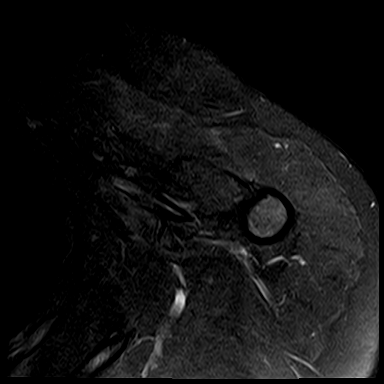
[im 7/24]
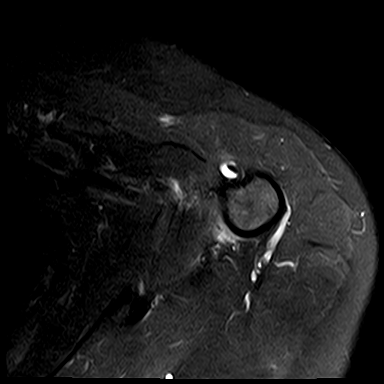
[im 10/24]
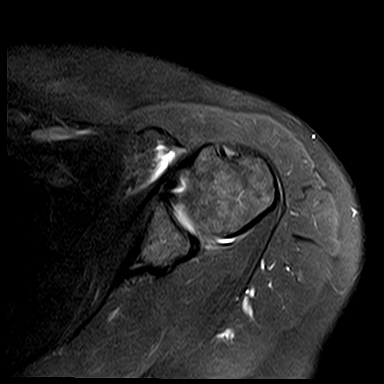
[im 14/24]
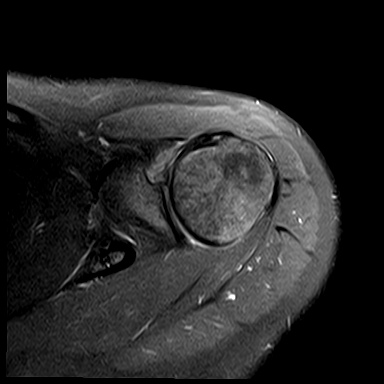
[im 17/24]
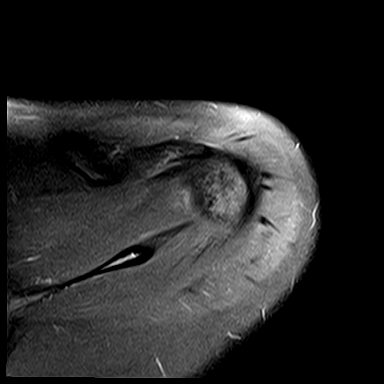
[im 20/24]
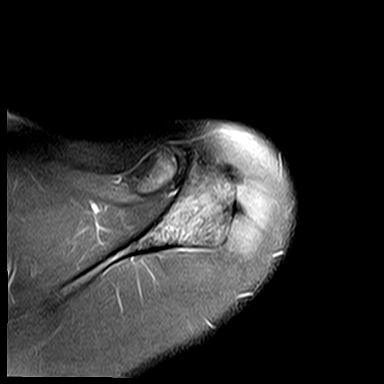
[im 24/24]
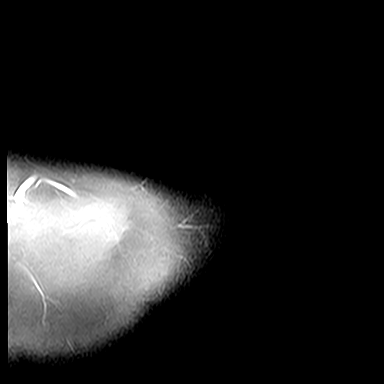

[Series 10: PD fat-sat · oblique · left · 3.5mm · 0.47mm/px · 6 of 18 slices shown (2 of 2)]
[im 1/18]
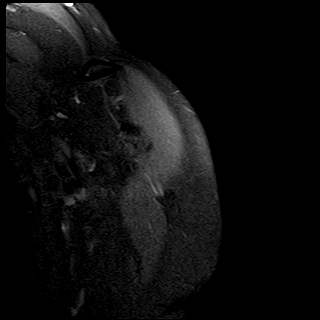
[im 4/18]
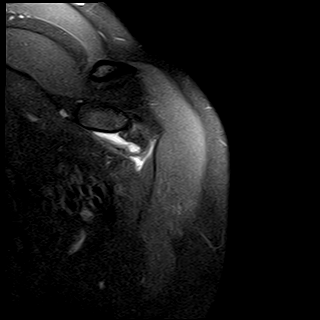
[im 7/18]
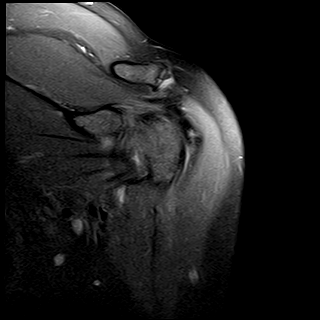
[im 11/18]
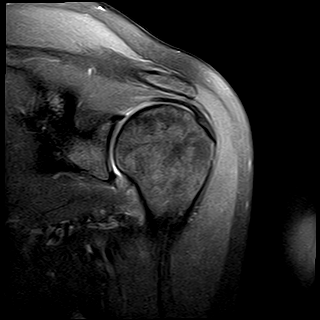
[im 14/18]
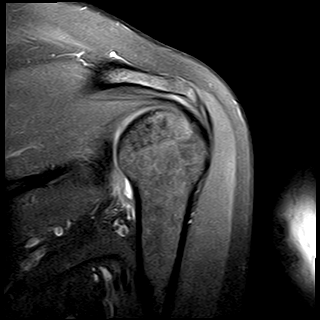
[im 18/18]
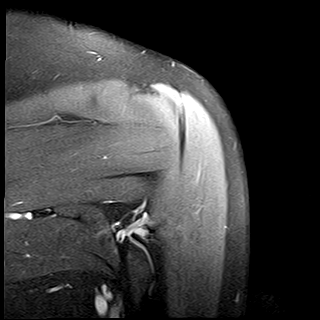

[Series 11: STIR · oblique · left · 3.5mm · 0.47mm/px · 5 of 18 slices shown (2 of 2)]
[im 1/18]
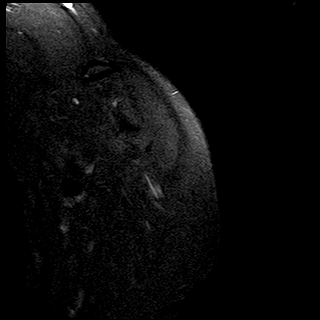
[im 4/18]
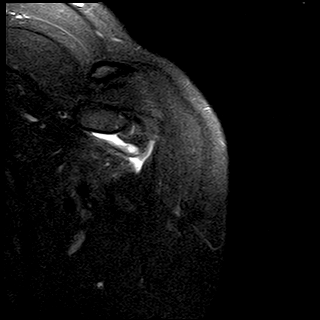
[im 7/18]
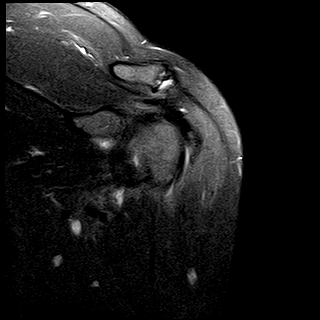
[im 11/18]
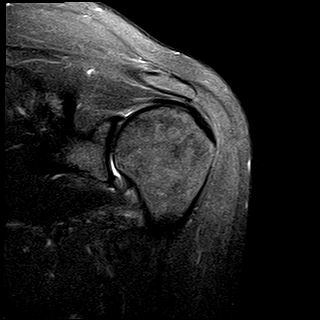
[im 14/18]
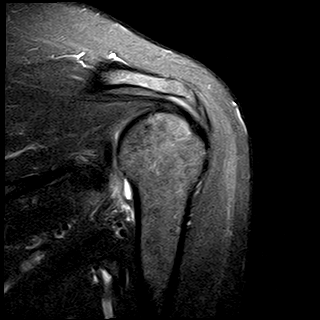

[39 of 40 positions shown; findings below may reference images not displayed]

FINDINGS: No acute bony lesions are seen at the left shoulder.  No evidence of rotator cuff tear.  Degenerative changes of AC joint mildly impinges on subacromion space and the supraspinatus causing mild supraspinatus tendinitis.  Small effusion in the subacromion bursa is noted.

No acute findings of the glenoid labrum. Long head of biceps tendon is intact.
IMPRESSION: 1. No acute bony lesions of the left shoulder.

2. Degenerative changes of AC joint mildly impinges on supraspinatus in the subacromion region.  Mild supraspinatus tendinitis with small effusion in the subacromion bursa.  There is no evidence of rotator cuff tear.

## 2023-06-04 ENCOUNTER — Other Ambulatory Visit (HOSPITAL_COMMUNITY): Payer: Self-pay

## 2023-06-04 ENCOUNTER — Other Ambulatory Visit: Payer: Self-pay

## 2023-06-04 ENCOUNTER — Ambulatory Visit (HOSPITAL_COMMUNITY)
Admission: RE | Admit: 2023-06-04 | Discharge: 2023-06-04 | Disposition: A | Discharge status: Still In Hospital | Payer: 59 | Source: Ambulatory Visit

## 2023-06-04 DIAGNOSIS — M7522 Bicipital tendinitis, left shoulder: Secondary | ICD-10-CM | POA: Insufficient documentation

## 2023-06-04 DIAGNOSIS — M7542 Impingement syndrome of left shoulder: Secondary | ICD-10-CM

## 2023-06-04 DIAGNOSIS — M7592 Shoulder lesion, unspecified, left shoulder: Secondary | ICD-10-CM

## 2023-06-04 NOTE — Progress Notes (Signed)
[facility]  Outpatient Physical Therapy  [address]  [place], [address]  [phone]  (Fax) [phone]    Physical Therapy Progress Note    Date: 06/04/2023  Patient's Name: Erica Solomon  Date of Birth: 01-Dec-1975  Physical Therapy Progress Note           Visit #/POC: 9 of 24  Authorization: 6 of 20 (until 04/10/24)  POC Signed?: yes  POC Ends: 07/16/23 (new order as of 8/13)  Next Progress Note Due: last performed on 06/04/23        Evaluating Physical Therapist: Unk Lightning, DPT  PT diagnosis/Reason for Referral: L supraspinatus tendinitis/shoulder impingement   Next Scheduled Physician Appointment: TBD  Allergies/Contraindications: NKA        Subjective: Patient returning this date for first f/u appointment since 2023/05/15 due to unexpected death in family. She had previously reported 60% improvement at her appointment on May 15, 2023 with improved strength/mobility and reduced pain. She was no longer awakening at night and no radicular symptoms noted. Patient reports as of today (8/13) her L shoulder has slightly regressed, but she was able to start performing her original HEP a few times over the last week. She had an injection yesterday and reports MRI of her shoulder revealed supraspinatus tendinitis and shoulder impingement. She was given a new order to return to therapy to continue to address L shoulder deficits.        Objective: re-assessment of  functional outcomes and objective measures performed today. Treatment held 2/2 patient receiving injection in L shoulder <24 hrs prior to session.        Patient-Specific Functional Score:  Problem Score 05-15-23 8/13   1. Washing hair/reaching behind back  5 8 8    2. Swimming  0 0 0   3. Shaving  7 7 7    TOTAL 4 5 5          Shoulder AROM    right Left  May 15, 2023 Left 8/13   Flexion 180 97/  132 110   Extension 80 50/ 50 48   Abduction 180 78/  124 100   Adduction NT NT NT   ER C7 L ear/  lateral C7 C5   IR Contralateral inferior  border of scapula  L buttock / near PSIS L buttock            EXERCISE/ACTIVITY NAME REPETITIONS RESISTANCE COMPLETED THIS DOS   PROM/JT mobs     L GH AP and inferior mobs to reduce posterior capsule tightness       X10' Manual     manual N     N   STM to Biceps      manual N   Moist heat L anterior chest wall  X5'    N   Supine pectoralis stretch with pool noodle along t-spine    2 min  on pool noodle N   Wand flexion  Wand ER  Standing wand extension 2x15  2x15  2 x 10   N  N  N   supine PB abduction 10   N   Sidelying ER 10   N   Supine scapular retraction 10 x 5 sec    N   LTR with arms at 90* 10   N   scapular PNFs (passive)     N   HEP review       Re-assessment of functional outcomes and objective measures    Y      *  Patient states she has started her HEP again with pulleys and AAROM with PVC pipe. Therapist suggested anterior chest wall stretching to be added back at this time as well.      Assessment: Patient returning to clinic for first session since 05/10/23. See subjective. Patient was making slow but steady (+) progress with L shoulder function prior to her 3 week absence from clinic. Although she has regressed, patient still demonstrates improved mobility in L shoulder than prior to evaluation. See objective measurements above. PT plan is to gradually return patient to prior exercise and therapeutic intervention load to restore progress made with pain, ROM, strength, and functional activity tolerance of L UE. Patient will benefit from continued skilled PT services at this time to address remaining deficits and improved functional mobility/QOL.        Short term goals (3 weeks):   -Patient will demo independence with HEP to maximize functional outcomes. MET 7/19  -Patient will self report greater than or equal to 25% improvement with progress towards reaching functional goals and improving QOL. MET 7/19  -Patient will improve L shoulder AROM to 90% or greater of R shoulder AROM for improved functional  mobility and activity participation. PROGRESSING 06/04/23       Long term goals (6 weeks):  -Patient will improve gross L shoulder MMT to 5/5 to improve L UE strength for functional activity performance.   -Patient will improve total score on patient specific functional score to >/= 7 for improved patient tolerance during ADL, work, and recreational related activities. PROGRESSING 06/04/23  -Patient will demonstrate improved muscular dysfunction in the L bicep to improve flexibility, strength, and reduce pain. PROGRESSING 06/04/23  -Patient will report less than or equal to 4/10 max pain in the L shoulder during exacerbating activities for improved QOL and activity tolerance. PROGRESSING 06/04/23     Plan: gradually re-introduce mobility and strengthening exercises for the L shoulder     Total Session Time 20 and Timed code minutes 20  THERAPEUTIC EXERCISE 20 minutes      Unk Lightning, PT  06/04/2023, 13:20

## 2023-06-06 ENCOUNTER — Other Ambulatory Visit: Payer: Self-pay

## 2023-06-06 ENCOUNTER — Ambulatory Visit (HOSPITAL_COMMUNITY)
Admission: RE | Admit: 2023-06-06 | Discharge: 2023-06-06 | Disposition: A | Discharge status: Still In Hospital | Payer: 59 | Source: Ambulatory Visit

## 2023-06-06 NOTE — PT Treatment (Signed)
[facility]  Outpatient Physical Therapy  [address]  [place], [address]  [phone]  (Fax) [phone]    Physical Therapy Treatment Note    Date: 06/06/2023  Patient's Name: Erica Solomon  Date of Birth: Jun 29, 1976  Physical Therapy Visit            Visit #/POC: 10 of 24  Authorization: 7 of 20 (until 04/10/24)  POC Signed?: yes  POC Ends: 07/16/23 (new order as of 8/13)  Next Progress Note Due: last performed on 06/04/23        Evaluating Physical Therapist: Unk Lightning, DPT  PT diagnosis/Reason for Referral: L supraspinatus tendinitis/shoulder impingement   Next Scheduled Physician Appointment: TBD  Allergies/Contraindications: NKA        Subjective: Patient reports that the steroid shot has helped since Monday and demonstrates improved active mobility. Still unable to sleep on affected limb.         Objective: Warm up on UBE followed by reintroduction of exercises per patient tol. Improved end range at shoulder noted, but does continue to be limited without compensatory mechanisms noted. Patient is less guarded and painful with activities.            EXERCISE/ACTIVITY NAME REPETITIONS RESISTANCE COMPLETED THIS DOS   PROM/JT mobs     L GH AP and inferior mobs to reduce posterior capsule tightness       X10' Manual     manual y     y   STM to Biceps      manual N   Moist heat L anterior chest wall  X5'    N   Supine pectoralis stretch with pool noodle along t-spine    2 min  on pool noodle N   Wand flexion  Wand ER  Standing wand extension 2x15  2x15  2 x 10   y  y  y   supine PB abduction 10   Y    Sidelying ER 10   N   Supine scapular retraction 10 x 5 sec    N   LTR with arms at 90* 10   N   scapular PNFs (passive)     y   HEP review          Re-assessment of functional outcomes and objective measures      n           Assessment: Good response to therapy today with more active exercises.        Short term goals (3 weeks):   -Patient will demo independence with  HEP to maximize functional outcomes. MET 7/19  -Patient will self report greater than or equal to 25% improvement with progress towards reaching functional goals and improving QOL. MET 7/19  -Patient will improve L shoulder AROM to 90% or greater of R shoulder AROM for improved functional mobility and activity participation. PROGRESSING 06/04/23        Long term goals (6 weeks):  -Patient will improve gross L shoulder MMT to 5/5 to improve L UE strength for functional activity performance.   -Patient will improve total score on patient specific functional score to >/= 7 for improved patient tolerance during ADL, work, and recreational related activities. PROGRESSING 06/04/23  -Patient will demonstrate improved muscular dysfunction in the L bicep to improve flexibility, strength, and reduce pain. PROGRESSING 06/04/23  -Patient will report less than or equal to 4/10 max pain in the L shoulder during exacerbating activities for  improved QOL and activity tolerance. PROGRESSING 06/04/23     Plan: Cont to progress with mobility and strengthening exercises.     Total Session Time 40, Timed code minutes 40, and Untimed code minutes 0  THERAPEUTIC EXERCISE 40 minutes      Laurence Aly, PTA  06/06/2023, 11:08

## 2023-06-07 ENCOUNTER — Ambulatory Visit (HOSPITAL_COMMUNITY): Payer: Self-pay

## 2023-06-11 ENCOUNTER — Ambulatory Visit (HOSPITAL_COMMUNITY)
Admission: RE | Admit: 2023-06-11 | Discharge: 2023-06-11 | Disposition: A | Discharge status: Still In Hospital | Payer: 59 | Source: Ambulatory Visit

## 2023-06-11 ENCOUNTER — Other Ambulatory Visit: Payer: Self-pay

## 2023-06-11 NOTE — PT Treatment (Signed)
[facility]  Outpatient Physical Therapy  [address]  [place], [address]  [phone]  (Fax) [phone]    Physical Therapy Treatment Note    Date: 06/11/2023  Patient's Name: Erica Solomon  Date of Birth: [birth year]  Physical Therapy Visit          Visit #/POC: 11 of 24  Authorization: 8 of 20 (until 04/10/24)  POC Signed?: yes  POC Ends: 07/16/23 (new order as of 8/13)  Next Progress Note Due: last performed on 06/04/23        Evaluating Physical Therapist: Unk Lightning, DPT  PT diagnosis/Reason for Referral: L supraspinatus tendinitis/shoulder impingement   Next Scheduled Physician Appointment: TBD  Allergies/Contraindications: NKA        Subjective: Patient reports no pain upon arrival. She notes her shoulder continues to improve. States steroid injection has seemed to help as well.         Objective: see below for treatment details     EXERCISE/ACTIVITY NAME REPETITIONS RESISTANCE COMPLETED THIS DOS   PROM/JT mobs     L GH AP and inferior mobs to reduce posterior capsule tightness       X10' Manual     manual N     Y   STM to Biceps      manual N   Moist heat L anterior chest wall  X5'    N   Supine pectoralis stretch with pool noodle along t-spine    2 min  on pool noodle N   Wand flexion  Wand ER  Standing wand extension 2x15  2x15  2 x 10   N  N  N   supine PB abduction 10   N   Sidelying ER 10   N   Supine scapular retraction 10 x 5 sec    N   LTR with arms at 90* 10   N   scapular PNFs (passive)     Y   L shoulder flexion (supine) with PT inferior GH joint mob 2x10  Y   K-taping for anterior shoulder instability   One strip from A to P GH joint line Y    Kneeling lat stretch BIL  2x30" holds  Elbows on table  Y   Quadruped platform slides   --flexion (BIL)    --lateral (BIL)     1x10 active  1x10 resist  1x10 active  1x10 resist        YTB    YTB     Y  Y  Y  Y            Assessment: Patient with (+) response for today's treatment. She reports reduced pain and  demo's improved flexion AROM with PT inferior GH mob. Patient also reports improved pain with mid row exercise following K-tape technique as described above. Patient tolerated new L shoulder stability exercises well this date. Reports no pain post session. Patient will benefit from continued skilled PT services at this time to address remaining deficits and improved functional mobility/QOL.        Short term goals (3 weeks):   -Patient will demo independence with HEP to maximize functional outcomes. MET 7/19  -Patient will self report greater than or equal to 25% improvement with progress towards reaching functional goals and improving QOL. MET 7/19  -Patient will improve L shoulder AROM to 90% or greater of R shoulder AROM for improved functional mobility and activity participation. PROGRESSING 06/04/23  Long term goals (6 weeks):  -Patient will improve gross L shoulder MMT to 5/5 to improve L UE strength for functional activity performance.   -Patient will improve total score on patient specific functional score to >/= 7 for improved patient tolerance during ADL, work, and recreational related activities. PROGRESSING 06/04/23  -Patient will demonstrate improved muscular dysfunction in the L bicep to improve flexibility, strength, and reduce pain. PROGRESSING 06/04/23  -Patient will report less than or equal to 4/10 max pain in the L shoulder during exacerbating activities for improved QOL and activity tolerance. PROGRESSING 06/04/23     Plan: Progress L shoulder ROM. Manual therapy if applicable. Focus on L shoulder stability training.     Total Session Time 42 and Timed code minutes 42  THERAPEUTIC EXERCISE 28 minutes and JOINT MOBILIZATION/MFR 14 minutes      Unk Lightning, PT  06/11/2023, 13:12

## 2023-06-14 ENCOUNTER — Ambulatory Visit (HOSPITAL_COMMUNITY)
Admission: RE | Admit: 2023-06-14 | Discharge: 2023-06-14 | Disposition: A | Discharge status: Still In Hospital | Payer: 59 | Source: Ambulatory Visit

## 2023-06-14 ENCOUNTER — Other Ambulatory Visit: Payer: Self-pay

## 2023-06-14 NOTE — PT Treatment (Signed)
[facility] Medicine [facility]  Outpatient Physical Therapy  [address]  [place], [address]  [phone]  (Fax) [phone]    Physical Therapy Treatment Note    Date: 06/14/2023  Patient's Name: Erica Solomon  Date of Birth: August 02, 1976  Physical Therapy Visit            Visit #/POC: 12 of 24  Authorization: 9 of 20 (until 04/10/24)  POC Signed?: yes  POC Ends: 07/16/23 (new order as of 8/13)  Next Progress Note Due: last performed on 06/04/23        Evaluating Physical Therapist: Unk Lightning, DPT  PT diagnosis/Reason for Referral: L supraspinatus tendinitis/shoulder impingement   Next Scheduled Physician Appointment: TBD  Allergies/Contraindications: NKA        Subjective: Patient with no new complaints today. She reports no pain upon arrival. States she took the tape off yesterday, it seemed to help.         Objective: see below for treatment details      EXERCISE/ACTIVITY NAME REPETITIONS RESISTANCE COMPLETED THIS DOS   UBE  X6'  LV1 Y   PROM/JT mobs     L GH AP and inferior mobs to reduce posterior capsule tightness       X10' Manual     manual N     Y   Supine pectoralis stretch on half foam roll 4 min  half foam roll Y   Wand flexion  Wand ER  Standing wand extension 2x15  2x15  2 x 10   N  N  N   supine PB abduction 10   N   Sidelying ER 10   N   Supine scapular retraction 10 x 5 sec    N   LTR with arms at 90* 10   N   scapular PNFs (passive)     N   L shoulder flexion (supine) with PT inferior GH joint mob 2x10   Y   K-taping for anterior shoulder instability    One strip from A to P GH joint line N   Kneeling lat stretch BIL  1x2' hold with thoracic breathing   Elbows on table  Y   Quadruped platform slides   --flexion (BIL)     --lateral (BIL)       1x10 active  1x10 resist  1x10 active  1x10 resist           YTB     YTB       N  N  N  N   Sleeper stretch 1x20 w/ 3" holds   Y   Contract relax ER stretch (IR active) 1x15 YTB Y            Assessment: Patient continues to  demonstrate improved pain with stiffness > pain at end range stretching today; however, limitations still noted IR > ER of the L shoulder. Patient demo's slight improvements with new stretching techniques performed today. See above for further details. She had good response to taping technique performed at last session. Taping not performed this date. Patient will benefit from continued skilled PT services at this time to address remaining deficits and improved functional mobility/QOL.         Short term goals (3 weeks):   -Patient will demo independence with HEP to maximize functional outcomes. MET 7/19  -Patient will self report greater than or equal to 25% improvement with progress towards reaching functional goals and improving QOL. MET 7/19  -  Patient will improve L shoulder AROM to 90% or greater of R shoulder AROM for improved functional mobility and activity participation. PROGRESSING 06/04/23        Long term goals (6 weeks):  -Patient will improve gross L shoulder MMT to 5/5 to improve L UE strength for functional activity performance.   -Patient will improve total score on patient specific functional score to >/= 7 for improved patient tolerance during ADL, work, and recreational related activities. PROGRESSING 06/04/23  -Patient will demonstrate improved muscular dysfunction in the L bicep to improve flexibility, strength, and reduce pain. PROGRESSING 06/04/23  -Patient will report less than or equal to 4/10 max pain in the L shoulder during exacerbating activities for improved QOL and activity tolerance. PROGRESSING 06/04/23     Plan: Progress L shoulder ROM, focus on IR/ER. Manual therapy if applicable. Continue progressing L shoulder stability training.    Total Session Time 42 and Timed code minutes 42  THERAPEUTIC EXERCISE 30 minutes and JOINT MOBILIZATION/MFR 12 minutes      Unk Lightning, PT  06/14/2023, 13:22

## 2023-06-18 ENCOUNTER — Ambulatory Visit: Admission: RE | Admit: 2023-06-18 | Payer: 59 | Source: Ambulatory Visit

## 2023-06-18 ENCOUNTER — Other Ambulatory Visit: Payer: Self-pay

## 2023-06-18 NOTE — PT Treatment (Signed)
[facility] Medicine [facility]  Outpatient Physical Therapy  [address]  [place], [address]  [phone]  (Fax) [phone]    Physical Therapy Treatment Note    Date: 06/18/2023  Patient's Name: Erica Solomon  Date of Birth: 04/29/1976  Physical Therapy Visit            Visit #/POC: 13 of 24  Authorization: 10 of 20 (until 04/10/24)  POC Signed?: yes  POC Ends: 07/16/23 (new order as of 8/13)  Next Progress Note Due: last performed on 06/04/23        Evaluating Physical Therapist: Unk Lightning, DPT  PT diagnosis/Reason for Referral: L supraspinatus tendinitis/shoulder impingement   Next Scheduled Physician Appointment: TBD  Allergies/Contraindications: NKA        Subjective: Patient with no new complaints today. She reports no pain upon arrival. States she took the tape off yesterday, it seemed to help.         Objective: see below for treatment details      EXERCISE/ACTIVITY NAME REPETITIONS RESISTANCE COMPLETED THIS DOS   UBE  X6'  LV1 Y   PROM/JT mobs     L GH AP and inferior mobs to reduce posterior capsule tightness       X10' Manual     manual N     Y   Supine pectoralis stretch on half foam roll 4 min  half foam roll Y   Wand flexion  Wand ER  Standing wand extension 2x15  2x15  2 x 10   N  N  N   supine PB abduction 10   N   Sidelying ER 10   N   Supine scapular retraction 10 x 5 sec    N   LTR with arms at 90* 10   N   scapular PNFs (passive)     N   L shoulder flexion (supine) with PT inferior GH joint mob 2x10   Y   K-taping for anterior shoulder instability    One strip from A to P GH joint line N   Kneeling lat stretch BIL  1x2' hold with thoracic breathing   Elbows on table  Y   Quadruped platform slides   --flexion (BIL)     --lateral (BIL)       1x10 active  1x10 resist  1x10 active  1x10 resist           YTB     YTB       N  N  N  N   Sleeper stretch 1x20 w/ 3" holds    Y   Contract relax ER stretch (IR active) 1x15 YTB Y            Assessment: Patient continues to  demonstrate improved pain with stiffness > pain at end range stretching today; however, limitations still noted IR > ER of the L shoulder. Patient demo's slight improvements with new stretching techniques performed today. See above for further details. She had good response to taping technique performed at last session. Taping not performed this date. Patient will benefit from continued skilled PT services at this time to address remaining deficits and improved functional mobility/QOL.         Short term goals (3 weeks):   -Patient will demo independence with HEP to maximize functional outcomes. MET 7/19  -Patient will self report greater than or equal to 25% improvement with progress towards reaching functional goals and improving QOL. MET 7/19  -  Patient will improve L shoulder AROM to 90% or greater of R shoulder AROM for improved functional mobility and activity participation. PROGRESSING 06/04/23        Long term goals (6 weeks):  -Patient will improve gross L shoulder MMT to 5/5 to improve L UE strength for functional activity performance.   -Patient will improve total score on patient specific functional score to >/= 7 for improved patient tolerance during ADL, work, and recreational related activities. PROGRESSING 06/04/23  -Patient will demonstrate improved muscular dysfunction in the L bicep to improve flexibility, strength, and reduce pain. PROGRESSING 06/04/23  -Patient will report less than or equal to 4/10 max pain in the L shoulder during exacerbating activities for improved QOL and activity tolerance. PROGRESSING 06/04/23     Plan: Progress L shoulder ROM, focus on IR/ER. Manual therapy if applicable. Continue progressing L shoulder stability training.    Total Session Time 35, Timed code minutes 35, and Untimed code minutes 0  THERAPEUTIC EXERCISE 35 minutes      Laurence Aly, PTA  06/18/2023, 14:00

## 2023-06-19 ENCOUNTER — Ambulatory Visit
Admission: RE | Admit: 2023-06-19 | Discharge: 2023-06-19 | Disposition: A | Discharge status: Still In Hospital | Payer: 59 | Source: Ambulatory Visit

## 2023-06-19 ENCOUNTER — Other Ambulatory Visit: Payer: Self-pay

## 2023-06-19 NOTE — PT Treatment (Signed)
[facility] Medicine [facility]  Outpatient Physical Therapy  [address]  [place], [address]  [phone]  (Fax) [phone]    Physical Therapy Treatment Note    Date: 06/19/2023  Patient's Name: Erica Solomon  Date of Birth: [birth year]  Physical Therapy Visit            Visit #/POC: 14 of 24  Authorization: 11 of 20 (until 04/10/24)  POC Signed?: yes  POC Ends: 07/16/23 (new order as of 8/13)  Next Progress Note Due: last performed on 06/04/23        Evaluating Physical Therapist: Unk Lightning, DPT  PT diagnosis/Reason for Referral: L supraspinatus tendinitis/shoulder impingement   Next Scheduled Physician Appointment: TBD  Allergies/Contraindications: NKA        Subjective: Patient states she was a little sore after last session but nothing too bad.         Objective: see below for treatment details. Worked more on inferior and posterior placement of L GH along with end range mobility. Still has end range tightness with ER in which she goes into a compensatory pattern.      EXERCISE/ACTIVITY NAME REPETITIONS RESISTANCE COMPLETED THIS DOS   UBE  X6'  LV1 Y   PROM/JT mobs     L GH AP and inferior mobs to reduce posterior capsule tightness       X10' Manual     manual y     Y   Supine pectoralis stretch on half foam roll 4 min  half foam roll n   Wand flexion  Wand ER  Standing wand extension 2x15  2x15  2 x 10   N  N  N   supine PB abduction 10   N   Sidelying ER 10   N   Supine scapular retraction 10 x 5 sec    N   LTR with arms at 90* 10   N   scapular PNFs (passive)     N   L shoulder flexion (supine) with PT inferior GH joint mob 2x10   Y   K-taping for anterior shoulder instability    One strip from A to P GH joint line N   Kneeling lat stretch BIL  1x2' hold with thoracic breathing   Elbows on table  Y   Quadruped platform slides   --flexion (BIL)     --lateral (BIL)       1x10 active  1x10 resist  1x10 active  1x10 resist           YTB     YTB       N  N  N  N   Sleeper stretch 1x20  w/ 3" holds    Y   Contract relax ER stretch (IR active) 1x15 YTB Y   Supine 90/90 ER stretch with MD 10x Red MB y   Open books 10   y            Assessment: Less soreness and stiffness noted at end of session. Improvement with mobility in joint capsule also noted during mobs.        Short term goals (3 weeks):   -Patient will demo independence with HEP to maximize functional outcomes. MET 7/19  -Patient will self report greater than or equal to 25% improvement with progress towards reaching functional goals and improving QOL. MET 7/19  -Patient will improve L shoulder AROM to 90% or greater of R shoulder AROM for improved  functional mobility and activity participation. PROGRESSING 06/04/23        Long term goals (6 weeks):  -Patient will improve gross L shoulder MMT to 5/5 to improve L UE strength for functional activity performance.   -Patient will improve total score on patient specific functional score to >/= 7 for improved patient tolerance during ADL, work, and recreational related activities. PROGRESSING 06/04/23  -Patient will demonstrate improved muscular dysfunction in the L bicep to improve flexibility, strength, and reduce pain. PROGRESSING 06/04/23  -Patient will report less than or equal to 4/10 max pain in the L shoulder during exacerbating activities for improved QOL and activity tolerance. PROGRESSING 06/04/23     Plan: Cont to work on end range functional rotation.       Total Session Time 40, Timed code minutes 40, and Untimed code minutes 0  THERAPEUTIC EXERCISE 40 minutes      Laurence Aly, PTA  06/19/2023, 13:16

## 2023-06-25 ENCOUNTER — Other Ambulatory Visit: Payer: Self-pay

## 2023-06-25 ENCOUNTER — Ambulatory Visit (HOSPITAL_COMMUNITY)
Admission: RE | Admit: 2023-06-25 | Discharge: 2023-06-25 | Disposition: A | Discharge status: Still In Hospital | Payer: 59 | Source: Ambulatory Visit

## 2023-06-25 DIAGNOSIS — M7522 Bicipital tendinitis, left shoulder: Secondary | ICD-10-CM | POA: Insufficient documentation

## 2023-06-25 NOTE — PT Treatment (Signed)
[facility] Medicine [facility]  Outpatient Physical Therapy  [address]  [place], [address]  [phone]  (Fax) [phone]    Physical Therapy Treatment Note    Date: 06/25/2023  Patient's Name: Erica Solomon  Date of Birth: [birth year]  Physical Therapy Visit            Visit #/POC: 15 of 24  Authorization: 12 of 20 (until 04/10/24)  POC Signed?: yes  POC Ends: 07/16/23 (new order as of 8/13)  Next Progress Note Due: last performed on 06/04/23        Evaluating Physical Therapist: Unk Lightning, DPT  PT diagnosis/Reason for Referral: L supraspinatus tendinitis/shoulder impingement   Next Scheduled Physician Appointment: TBD  Allergies/Contraindications: NKA        Subjective: Patient states she is doing well and has no pain complaints. Patient has attended 15 visits to date. States she is about 90% improved, and at worse 2/10.         Objective: Assessed goals and measurements taken. MMT in all planes 5/5 with no pain noted.     Patient-Specific Functional Score:  Problem Score 7/19 8/13 9/3   1. Washing hair/reaching behind back  5 8 8 9    2. Swimming  0 0 0 7   3. Shaving  7 7 7 8    TOTAL 4 5 5 8          Shoulder AROM    right Left  05/10/23 Left 8/13  Left 9/3   Flexion 180 97/  132 110 168   Extension 80 50/ 50 48 60   Abduction 180 78/  124 100 169   Adduction NT NT NT    ER C7 L ear/  lateral C7 C5 CT junction   IR Contralateral inferior border of scapula  L buttock / near PSIS L buttock  L5/L4        EXERCISE/ACTIVITY NAME REPETITIONS RESISTANCE COMPLETED THIS DOS   UBE  X6'  LV1 Y   PROM/JT mobs     L GH AP and inferior mobs to reduce posterior capsule tightness       X10' Manual     manual y     Y   Supine pectoralis stretch on half foam roll 4 min  half foam roll n   Wand flexion  Wand ER  Standing wand extension 2x15  2x15  2 x 10   N  N  N   supine PB abduction 10   N   Sidelying ER 10   N   Supine scapular retraction 10 x 5 sec    N   LTR with arms at 90* 10   N   scapular PNFs  (passive)     N   L shoulder flexion (supine) with PT inferior GH joint mob 2x10   Y   K-taping for anterior shoulder instability    One strip from A to P GH joint line N   Kneeling lat stretch BIL  1x2' hold with thoracic breathing   Elbows on table  Y   Quadruped platform slides   --flexion (BIL)     --lateral (BIL)       1x10 active  1x10 resist  1x10 active  1x10 resist           YTB     YTB       N  N  N  N   Sleeper stretch 1x20 w/ 3" holds  Y   Contract relax ER stretch (IR active) 1x15 YTB Y   Supine 90/90 ER stretch with MD 10x Red MB y   Open books 10   y            Assessment: Patient has met PT goals. Min tightness at end range flexion and abduction. Does have restriction in functional rotation but has improved since initial visit.        Short term goals (3 weeks):   -Patient will demo independence with HEP to maximize functional outcomes. MET 7/19  -Patient will self report greater than or equal to 25% improvement with progress towards reaching functional goals and improving QOL. MET 7/19  -Patient will improve L shoulder AROM to 90% or greater of R shoulder AROM for improved functional mobility and activity participation. MET 9/3        Long term goals (6 weeks):  -Patient will improve gross L shoulder MMT to 5/5 to improve L UE strength for functional activity performance. MET 9/3  -Patient will improve total score on patient specific functional score to >/= 7 for improved patient tolerance during ADL, work, and recreational related activities.MET9/3  -Patient will demonstrate improved muscular dysfunction in the L bicep to improve flexibility, strength, and reduce pain. PROGRESSING 06/04/23  -Patient will report less than or equal to 4/10 max pain in the L shoulder during exacerbating activities for improved QOL and activity tolerance.MET 9/3     Plan: DC next session per PT discretion.     Total Session Time 35, Timed code minutes 35, and Untimed code minutes 0  THERAPEUTIC EXERCISE 35  minutes      Laurence Aly, PTA  06/25/2023, 10:20

## 2023-06-27 ENCOUNTER — Ambulatory Visit (HOSPITAL_COMMUNITY): Payer: Self-pay

## 2023-07-03 ENCOUNTER — Ambulatory Visit
Admission: RE | Admit: 2023-07-03 | Discharge: 2023-07-03 | Disposition: A | Discharge status: Still In Hospital | Payer: 59 | Source: Ambulatory Visit

## 2023-07-03 ENCOUNTER — Other Ambulatory Visit: Payer: Self-pay

## 2023-07-03 NOTE — PT Treatment (Signed)
[facility] Medicine [facility]  Outpatient Physical Therapy  [address]  [place], [address]  [phone]  (Fax) [phone]    Physical Therapy Discharge Note    Date: 07/03/2023  Patient's Name: Erica Solomon  Date of Birth: 1976-09-11  Physical Therapy Discharge              Visit #/POC: 16 of 24  Authorization: 13 of 20 (until 04/10/24)  POC Signed?: yes  POC Ends: 07/16/23 (new order as of 8/13)  Next Progress Note Due: last performed on 06/04/23        Evaluating Physical Therapist: Unk Lightning, DPT  PT diagnosis/Reason for Referral: L supraspinatus tendinitis/shoulder impingement   Next Scheduled Physician Appointment: TBD  Allergies/Contraindications: NKA     DISCHARGE NOTE  Patient has met all goals and has returned to all functional activities without limitations, per Patient-specific functional scale.  She will therefore be discharged a this time.  See note below for reassessment of goals and objective measurements.  Interventions included therex and joint mobilizations for the treatment of left supraspinatus tendinitis.     Subjective:  States that she doesn't really have any pain in her shoulder anymore. She notes that she is able to fix her own hair without difficulty and she is able to now sleep on her L side without waking up due to pain. She also reports that she has no trouble with ADLs.     Objective: Assessed goals and measurements MMT in all planes 5/5 with no pain noted.     Patient-Specific Functional Score:  Problem Score 7/19 8/13 9/3 9/11   1. Washing hair/reaching behind back  5 8 8 9 10    2. Swimming  0 0 0 7 10   3. Shaving  7 7 7 8 8    TOTAL 4 5 5 8  9.33         Shoulder AROM    right Left  05/10/23 Left 8/13  Left 9/3 Left 9/11   Flexion 180 97/  132 110 168 160   Extension 80 50/ 50 48 60 60   Abduction 180 78/  124 100 169 160   Adduction NT NT NT  NT   ER C7 L ear/  lateral C7 C5 CT junction CT Junction   IR Contralateral inferior border of scapula  L  buttock / near PSIS L buttock  L5/L4 T12        EXERCISE/ACTIVITY NAME REPETITIONS RESISTANCE COMPLETED THIS DOS   UBE  X6'  LV1 n   PROM/JT mobs     L GH AP and inferior mobs to reduce posterior capsule tightness       X10' Manual     manual n     n   Supine pectoralis stretch on half foam roll 4 min  half foam roll n   Wand flexion  Wand ER  Standing wand extension 2x15  2x15  2 x 10   N  N  N   supine PB abduction 10   N   Sidelying ER 10   N   Supine scapular retraction 10 x 5 sec    N   LTR with arms at 90* 10   N   scapular PNFs (passive)     N   L shoulder flexion (supine) with PT inferior GH joint mob 2x10   n   K-taping for anterior shoulder instability    One strip from A to P GH joint  line N   Kneeling lat stretch BIL  1x2' hold with thoracic breathing   Elbows on table  n   Quadruped platform slides   --flexion (BIL)     --lateral (BIL)       1x10 active  1x10 resist  1x10 active  1x10 resist           YTB     YTB       N  N  N  N   Sleeper stretch 1x20 w/ 3" holds    n   Contract relax ER stretch (IR active) 1x15 YTB n   Supine 90/90 ER stretch with MD 10x Red MB n   Open books 10   n            Assessment:  Pt has much improved ROM and strength. She has met all goals for therapy and is ready for d/c to HEP at this time.   Short term goals (3 weeks):   -Patient will demo independence with HEP to maximize functional outcomes. MET 7/19  -Patient will self report greater than or equal to 25% improvement with progress towards reaching functional goals and improving QOL. MET 7/19  -Patient will improve L shoulder AROM to 90% or greater of R shoulder AROM for improved functional mobility and activity participation. MET 9/3        Long term goals (6 weeks):  -Patient will improve gross L shoulder MMT to 5/5 to improve L UE strength for functional activity performance. MET 9/3  -Patient will improve total score on patient specific functional score to >/= 7 for improved patient tolerance during ADL, work, and  recreational related activities.MET9/3  -Patient will demonstrate improved muscular dysfunction in the L bicep to improve flexibility, strength, and reduce pain. MET 9/11  -Patient will report less than or equal to 4/10 max pain in the L shoulder during exacerbating activities for improved QOL and activity tolerance.MET 9/3   Plan: This is pt's last visit she has met all goals at this time.     Total Session Time 15, Timed code minutes 15, and Untimed code minutes 0  THERAPEUTIC EXERCISE 15 minutes    Leah Bragg, PTA 07/03/2023 15:27

## 2024-06-25 ENCOUNTER — Other Ambulatory Visit (HOSPITAL_COMMUNITY): Payer: Self-pay | Admitting: Family Medicine

## 2024-06-25 ENCOUNTER — Encounter (HOSPITAL_COMMUNITY): Payer: Self-pay

## 2024-06-25 DIAGNOSIS — Z1231 Encounter for screening mammogram for malignant neoplasm of breast: Secondary | ICD-10-CM

## 2024-07-02 ENCOUNTER — Encounter (HOSPITAL_COMMUNITY): Payer: Self-pay

## 2024-07-02 ENCOUNTER — Other Ambulatory Visit: Payer: Self-pay

## 2024-07-02 ENCOUNTER — Ambulatory Visit
Admission: RE | Admit: 2024-07-02 | Discharge: 2024-07-02 | Disposition: A | Discharge status: Home / Self Care | Source: Ambulatory Visit | Attending: Family Medicine | Admitting: Family Medicine

## 2024-07-02 DIAGNOSIS — Z1231 Encounter for screening mammogram for malignant neoplasm of breast: Secondary | ICD-10-CM | POA: Insufficient documentation
